# Patient Record
Sex: Female | Born: 1960 | Hispanic: No | Smoking: Former smoker
Health system: Southern US, Community
[De-identification: ages and names within clinical notes are randomized; demographics above are authoritative.]

## PROBLEM LIST (undated history)

## (undated) DIAGNOSIS — G479 Sleep disorder, unspecified: Secondary | ICD-10-CM

## (undated) DIAGNOSIS — I499 Cardiac arrhythmia, unspecified: Secondary | ICD-10-CM

## (undated) DIAGNOSIS — K219 Gastro-esophageal reflux disease without esophagitis: Secondary | ICD-10-CM

## (undated) DIAGNOSIS — F419 Anxiety disorder, unspecified: Secondary | ICD-10-CM

## (undated) DIAGNOSIS — D649 Anemia, unspecified: Secondary | ICD-10-CM

## (undated) DIAGNOSIS — D259 Leiomyoma of uterus, unspecified: Secondary | ICD-10-CM

## (undated) DIAGNOSIS — I10 Essential (primary) hypertension: Secondary | ICD-10-CM

## (undated) DIAGNOSIS — G43109 Migraine with aura, not intractable, without status migrainosus: Secondary | ICD-10-CM

## (undated) HISTORY — DX: Essential (primary) hypertension: I10

## (undated) HISTORY — DX: Sleep disorder, unspecified: G47.9

## (undated) HISTORY — PX: OTHER SURGICAL HISTORY: SHX169

## (undated) HISTORY — DX: Gastro-esophageal reflux disease without esophagitis: K21.9

## (undated) HISTORY — DX: Cardiac arrhythmia, unspecified: I49.9

## (undated) HISTORY — DX: Anemia, unspecified: D64.9

## (undated) HISTORY — DX: Anxiety disorder, unspecified: F41.9

## (undated) HISTORY — DX: Migraine with aura, not intractable, without status migrainosus: G43.109

## (undated) HISTORY — DX: Leiomyoma of uterus, unspecified: D25.9

---

## 1986-05-24 HISTORY — PX: OVARIAN CYST REMOVAL: SHX89

## 2008-12-22 HISTORY — PX: CARDIOVASCULAR STRESS TEST: SHX262

## 2008-12-22 HISTORY — PX: DOPPLER ECHOCARDIOGRAPHY: SHX263

## 2011-02-22 LAB — HM MAMMOGRAPHY: HM Mammogram: NORMAL

## 2011-09-03 ENCOUNTER — Ambulatory Visit: Payer: Commercial Managed Care - PPO | Admitting: Internal Medicine

## 2011-10-28 ENCOUNTER — Other Ambulatory Visit: Payer: Self-pay | Admitting: Specialist

## 2011-10-28 DIAGNOSIS — M5410 Radiculopathy, site unspecified: Secondary | ICD-10-CM

## 2011-10-28 DIAGNOSIS — M542 Cervicalgia: Secondary | ICD-10-CM

## 2011-10-28 DIAGNOSIS — M545 Low back pain: Secondary | ICD-10-CM

## 2011-11-10 ENCOUNTER — Encounter: Payer: Self-pay | Admitting: Internal Medicine

## 2011-11-10 ENCOUNTER — Ambulatory Visit (INDEPENDENT_AMBULATORY_CARE_PROVIDER_SITE_OTHER): Payer: Commercial Managed Care - PPO | Admitting: Internal Medicine

## 2011-11-10 VITALS — BP 138/88 | HR 96 | Temp 98.9°F | Ht 63.0 in | Wt 130.0 lb

## 2011-11-10 DIAGNOSIS — G479 Sleep disorder, unspecified: Secondary | ICD-10-CM | POA: Insufficient documentation

## 2011-11-10 DIAGNOSIS — K219 Gastro-esophageal reflux disease without esophagitis: Secondary | ICD-10-CM | POA: Insufficient documentation

## 2011-11-10 DIAGNOSIS — G43109 Migraine with aura, not intractable, without status migrainosus: Secondary | ICD-10-CM

## 2011-11-10 DIAGNOSIS — I10 Essential (primary) hypertension: Secondary | ICD-10-CM

## 2011-11-10 DIAGNOSIS — I499 Cardiac arrhythmia, unspecified: Secondary | ICD-10-CM | POA: Insufficient documentation

## 2011-11-10 DIAGNOSIS — D259 Leiomyoma of uterus, unspecified: Secondary | ICD-10-CM

## 2011-11-10 DIAGNOSIS — D649 Anemia, unspecified: Secondary | ICD-10-CM | POA: Insufficient documentation

## 2011-11-10 LAB — CBC WITH DIFFERENTIAL/PLATELET
Basophils Absolute: 0 10*3/uL (ref 0.0–0.1)
Eosinophils Absolute: 0.2 10*3/uL (ref 0.0–0.7)
Lymphocytes Relative: 16.8 % (ref 12.0–46.0)
MCHC: 32.1 g/dL (ref 30.0–36.0)
MCV: 78.6 fl (ref 78.0–100.0)
Monocytes Absolute: 0.4 10*3/uL (ref 0.1–1.0)
Neutro Abs: 5.9 10*3/uL (ref 1.4–7.7)
Neutrophils Relative %: 75.4 % (ref 43.0–77.0)
RDW: 18.1 % — ABNORMAL HIGH (ref 11.5–14.6)

## 2011-11-10 LAB — HEPATIC FUNCTION PANEL
Bilirubin, Direct: 0 mg/dL (ref 0.0–0.3)
Total Bilirubin: 0.3 mg/dL (ref 0.3–1.2)
Total Protein: 7.1 g/dL (ref 6.0–8.3)

## 2011-11-10 LAB — BASIC METABOLIC PANEL
BUN: 11 mg/dL (ref 6–23)
CO2: 25 mEq/L (ref 19–32)
Calcium: 8.8 mg/dL (ref 8.4–10.5)
Creatinine, Ser: 0.9 mg/dL (ref 0.4–1.2)
Glucose, Bld: 101 mg/dL — ABNORMAL HIGH (ref 70–99)

## 2011-11-10 MED ORDER — OMEPRAZOLE 20 MG PO CPDR: 20.0000 mg | DELAYED_RELEASE_CAPSULE | Freq: Every day | ORAL | Status: DC

## 2011-11-10 NOTE — Assessment & Plan Note (Signed)
Uses fioricet mostly in perimenstrual time

## 2011-11-10 NOTE — Assessment & Plan Note (Signed)
Persistent bleeding Just tried progesterone challenge again May need gyn if not stopping

## 2011-11-10 NOTE — Patient Instructions (Signed)
Please request the last 3 years of records from your [facility] doctor Please stop the zolpidem Remus Loffler). If you are not sleeping well after a couple of weeks, call for a different prescription
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
## 2011-11-10 NOTE — Assessment & Plan Note (Signed)
BP Readings from Last 3 Encounters:  11/10/11 138/88   Control is fine ?arrhythmia in past --- nothing on exam now Will check labs

## 2011-11-10 NOTE — Assessment & Plan Note (Signed)
Prevacid OTC helped at first but not now Will try prilosec

## 2011-11-11 ENCOUNTER — Encounter: Payer: Self-pay | Admitting: *Deleted

## 2011-11-19 ENCOUNTER — Ambulatory Visit
Admission: RE | Admit: 2011-11-19 | Discharge: 2011-11-19 | Disposition: A | Discharge status: Home / Self Care | Payer: Commercial Managed Care - PPO | Source: Ambulatory Visit | Attending: Specialist | Admitting: Specialist

## 2011-11-19 VITALS — BP 97/59 | HR 63

## 2011-11-19 DIAGNOSIS — G43109 Migraine with aura, not intractable, without status migrainosus: Secondary | ICD-10-CM

## 2011-11-19 DIAGNOSIS — M5410 Radiculopathy, site unspecified: Secondary | ICD-10-CM

## 2011-11-19 DIAGNOSIS — M545 Low back pain, unspecified: Secondary | ICD-10-CM

## 2011-11-19 DIAGNOSIS — M542 Cervicalgia: Secondary | ICD-10-CM

## 2011-11-19 MED ORDER — MEPERIDINE HCL 100 MG/ML IJ SOLN
75.0000 mg | Freq: Once | INTRAMUSCULAR | Status: AC
  Administered 2011-11-19: 75 mg via INTRAMUSCULAR

## 2011-11-19 MED ORDER — DIAZEPAM 5 MG PO TABS
10.0000 mg | ORAL_TABLET | Freq: Once | ORAL | Status: AC
  Administered 2011-11-19: 10 mg via ORAL

## 2011-11-19 MED ORDER — ONDANSETRON HCL 4 MG/2ML IJ SOLN
4.0000 mg | Freq: Once | INTRAMUSCULAR | Status: AC
  Administered 2011-11-19: 4 mg via INTRAMUSCULAR

## 2011-11-19 MED ORDER — IOHEXOL 300 MG/ML  SOLN
10.0000 mL | Freq: Once | INTRAMUSCULAR | Status: AC | PRN
  Administered 2011-11-19: 10 mL via INTRATHECAL

## 2011-11-19 MED ORDER — HYDROCODONE-ACETAMINOPHEN 5-325 MG PO TABS
2.0000 | ORAL_TABLET | Freq: Once | ORAL | Status: AC
  Administered 2011-11-19: 2 via ORAL

## 2011-11-19 NOTE — Progress Notes (Signed)
States she has been off her Despipramine for at least the past two days. Larina Earthly, RN

## 2011-11-19 NOTE — Discharge Instructions (Signed)
Myelogram Discharge Instructions  1. Go home and rest quietly for the next 24 hours.  It is important to lie flat for the next 24 hours.  Get up only to go to the restroom.  You may lie in the bed or on a couch on your back, your stomach, your left side or your right side.  You may have one pillow under your head.  You may have pillows between your knees while you are on your side or under your knees while you are on your back.  2. DO NOT drive today.  Recline the seat as far back as it will go, while still wearing your seat belt, on the way home.  3. You may get up to go to the bathroom as needed.  You may sit up for 10 minutes to eat.  You may resume your normal diet and medications unless otherwise indicated.  Drink lots of extra fluids today and tomorrow.  4. The incidence of headache, nausea, or vomiting is about 5% (one in 20 patients).  If you develop a headache, lie flat and drink plenty of fluids until the headache goes away.  Caffeinated beverages may be helpful.  If you develop severe nausea and vomiting or a headache that does not go away with flat bed rest, call [phone].  5. You may resume normal activities after your 24 hours of bed rest is over; however, do not exert yourself strongly or do any heavy lifting tomorrow. If when you get up you have a headache when standing, go back to bed and force fluids for another 24 hours.  6. Call your physician for a follow-up appointment.  The results of your myelogram will be sent directly to your physician by the following day.  7. If you have any questions or if complications develop after you arrive home, please call [phone].  Discharge instructions have been explained to the patient.  The patient, or the person responsible for the patient, fully understands these instructions.       May resume desipramine on November 20, 2011, after 9:30 am.

## 2011-11-22 ENCOUNTER — Telehealth: Payer: Self-pay | Admitting: Radiology

## 2011-11-22 NOTE — Telephone Encounter (Signed)
Pt states if she gets up her head hurts and if she is lying down it is better. Will call Dr. Barbaraann Faster office to see if we can obtain an order for a blood patch. Office called at 8:50 am and will call us back.

## 2011-11-22 NOTE — Telephone Encounter (Signed)
Pt will re-evaluate the way she feels later today and decide if she wishes to have the blood patch.

## 2011-11-23 ENCOUNTER — Other Ambulatory Visit: Payer: Self-pay | Admitting: Specialist

## 2011-11-23 DIAGNOSIS — G971 Other reaction to spinal and lumbar puncture: Secondary | ICD-10-CM

## 2011-11-26 ENCOUNTER — Ambulatory Visit
Admission: RE | Admit: 2011-11-26 | Discharge: 2011-11-26 | Disposition: A | Discharge status: Home / Self Care | Payer: Commercial Managed Care - PPO | Source: Ambulatory Visit | Attending: Specialist | Admitting: Specialist

## 2011-11-26 DIAGNOSIS — G971 Other reaction to spinal and lumbar puncture: Secondary | ICD-10-CM

## 2011-11-26 MED ORDER — IOHEXOL 180 MG/ML  SOLN
1.0000 mL | Freq: Once | INTRAMUSCULAR | Status: AC | PRN
  Administered 2011-11-26: 1 mL via EPIDURAL

## 2011-11-26 MED ORDER — HYDROXYZINE HCL 50 MG/ML IM SOLN
50.0000 mg | Freq: Once | INTRAMUSCULAR | Status: AC
  Administered 2011-11-26: 50 mg via INTRAMUSCULAR

## 2011-11-26 MED ORDER — OXYCODONE-ACETAMINOPHEN 5-325 MG PO TABS
2.0000 | ORAL_TABLET | Freq: Once | ORAL | Status: AC
  Administered 2011-11-26: 2 via ORAL

## 2011-11-26 MED ORDER — HYDROMORPHONE HCL PF 2 MG/ML IJ SOLN
2.0000 mg | Freq: Once | INTRAMUSCULAR | Status: AC
  Administered 2011-11-26: 2 mg via INTRAMUSCULAR

## 2011-11-26 NOTE — Progress Notes (Signed)
20cc blood drawn from right AC space without difficulty for procedure; site unremarkable.  jkl 

## 2011-11-26 NOTE — Progress Notes (Signed)
Rollene Rotunda (friend) at bedside.  Dr. Alfredo Batty in to speak with patient and her friend.  jkl

## 2011-12-02 ENCOUNTER — Other Ambulatory Visit (HOSPITAL_COMMUNITY): Payer: Self-pay | Admitting: Specialist

## 2011-12-02 DIAGNOSIS — M545 Low back pain: Secondary | ICD-10-CM

## 2011-12-02 DIAGNOSIS — M542 Cervicalgia: Secondary | ICD-10-CM

## 2011-12-07 ENCOUNTER — Encounter (HOSPITAL_COMMUNITY)
Admission: RE | Admit: 2011-12-07 | Discharge: 2011-12-07 | Disposition: A | Discharge status: Home / Self Care | Payer: Commercial Managed Care - PPO | Source: Ambulatory Visit | Attending: Specialist | Admitting: Specialist

## 2011-12-07 DIAGNOSIS — M545 Low back pain, unspecified: Secondary | ICD-10-CM | POA: Insufficient documentation

## 2011-12-07 DIAGNOSIS — M542 Cervicalgia: Secondary | ICD-10-CM | POA: Insufficient documentation

## 2011-12-07 MED ORDER — TECHNETIUM TC 99M MEDRONATE IV KIT: 25.0000 | PACK | Freq: Once | INTRAVENOUS | Status: DC | PRN

## 2011-12-10 ENCOUNTER — Encounter: Payer: Self-pay | Admitting: Internal Medicine

## 2011-12-10 ENCOUNTER — Ambulatory Visit (INDEPENDENT_AMBULATORY_CARE_PROVIDER_SITE_OTHER): Payer: Commercial Managed Care - PPO | Admitting: Internal Medicine

## 2011-12-10 VITALS — BP 140/90 | HR 94 | Temp 98.5°F | Ht 63.0 in | Wt 131.0 lb

## 2011-12-10 DIAGNOSIS — H9190 Unspecified hearing loss, unspecified ear: Secondary | ICD-10-CM

## 2011-12-10 MED ORDER — PREDNISONE 20 MG PO TABS: 40.0000 mg | ORAL_TABLET | Freq: Every day | ORAL | Status: AC

## 2011-12-10 NOTE — Assessment & Plan Note (Signed)
Likely related to myelogram but not sure how Since she has mild hearing loss across the frequencies, will try steroid burst and set up with ENT

## 2011-12-10 NOTE — Progress Notes (Signed)
  Subjective:    Patient ID: Brittany Hess, female    DOB: [birth year], 51 y.o.   MRN: [id number]  HPI Had myelogram ~3 weeks ago Did find bony growth in cervical spine so considering surgery  Has had trouble with her hearing since the myelogram Had blood patch due to headaches after procedure Then had loud noise (like riding down road with windows open) dissapated some but now has pressure feeling Now with gurgling or some sensation of bubbling in ears  Sensitive to loud sounds---like closing door, or crumbling paper  "I just don't feel right"  Current Outpatient Prescriptions on File Prior to Visit  Medication Sig Dispense Refill  . amLODipine (NORVASC) 5 MG tablet Take 5 mg by mouth daily.       . benazepril (LOTENSIN) 40 MG tablet Take 40 mg by mouth daily.       . butalbital-acetaminophen-caffeine (FIORICET, ESGIC) 50-325-40 MG per tablet Take 1-2 tablets by mouth 2 (two) times daily as needed.       . norethindrone (AYGESTIN) 5 MG tablet Take 5 mg by mouth daily.      Marland Kitchen omeprazole (PRILOSEC) 20 MG capsule Take 1 capsule (20 mg total) by mouth daily.  30 capsule  11    No Known Allergies  Past Medical History  Diagnosis Date  . Hypertension   . GERD (gastroesophageal reflux disease)   . Migraine with aura   . Anemia   . Arrhythmia   . Fibroid uterus   . Sleep disturbance, unspecified     Past Surgical History  Procedure Date  . Ovarian cyst removal 1988    right  . Labia cyst     1980    Family History  Problem Relation Age of Onset  . Hypertension Mother   . Parkinsonism Mother   . Dementia Mother   . Hypertension Father   . Diabetes Neg Hx   . Cancer Neg Hx     History   Social History  . Marital Status: Divorced    Spouse Name: N/A    Number of Children: 1  . Years of Education: N/A   Occupational History  . Account management/sales     [employer]   Social History Main Topics  . Smoking status: Never Smoker   . Smokeless  tobacco: Never Used  . Alcohol Use: Yes     Occasional  . Drug Use: No  . Sexually Active: Not on file   Other Topics Concern  . Not on file   Social History Narrative   63 year old daughter lives with father now in [place].Will move here to attend GTCCSon died of SIDS    Review of Systems No cold symptoms like cough, rhinorrhea No sig GI symptoms    Objective:   Physical Exam  Constitutional: She appears well-developed and well-nourished. No distress.  HENT:  Nose: Nose normal.  Mouth/Throat: Oropharynx is clear and moist. No oropharyngeal exudate.       TMS and canals appear normal  Psychiatric: She has a normal mood and affect. Her behavior is normal. Thought content normal.          Assessment & Plan:

## 2012-01-07 ENCOUNTER — Ambulatory Visit (INDEPENDENT_AMBULATORY_CARE_PROVIDER_SITE_OTHER): Payer: Commercial Managed Care - PPO | Admitting: Internal Medicine

## 2012-01-07 ENCOUNTER — Encounter: Payer: Self-pay | Admitting: Internal Medicine

## 2012-01-07 VITALS — BP 120/80 | HR 81 | Temp 98.6°F | Wt 131.0 lb

## 2012-01-07 DIAGNOSIS — G479 Sleep disorder, unspecified: Secondary | ICD-10-CM

## 2012-01-07 DIAGNOSIS — I499 Cardiac arrhythmia, unspecified: Secondary | ICD-10-CM

## 2012-01-07 DIAGNOSIS — M5000 Cervical disc disorder with myelopathy, unspecified cervical region: Secondary | ICD-10-CM

## 2012-01-07 MED ORDER — TRAZODONE HCL 50 MG PO TABS: 50.0000 mg | ORAL_TABLET | Freq: Every day | ORAL | Status: DC

## 2012-01-07 NOTE — Assessment & Plan Note (Addendum)
Vague history of bradycardia Thinks heart was structurally normal though Symptoms improved off the diuretic Has soft aortic murmur Still has occ mild palpitations EKG was normal  I would like to defer surgery till I can get a copy of her echo from [place] If normal, would be okay to proceed She wants to get evaluation by neurologist first anyway ---since uncertainty about her lesions and the symptoms correlating properly

## 2012-01-07 NOTE — Patient Instructions (Signed)
Please get a copy of your echocardiogram from [facility]

## 2012-01-07 NOTE — Assessment & Plan Note (Signed)
Is just about off the ambien---takes 1/2 tab Melatonin didn't help Will try trazodone

## 2012-01-07 NOTE — Progress Notes (Signed)
Subjective:    Patient ID: Brittany Hess, female    DOB: 07-20-1960, 51 y.o.   MRN: [id number]  HPI Due for cervical spine surgery No specific date yet Here for clearance for Dr Otelia Sergeant  She is concerned because the findings on myelogram are on the right However she has left arm pain Also has left leg pain but lumbar findings are on right  Feels she needs "something" But is concerned about the uncertainty Wonders whether a neurology evaluation pre op would sense  No chest pain No SOB Occ gets palpitations---nothing new for her. Generally happens when at rest, but occ during a walk Sensation of going too fast then--will last a few seconds Does get some SOB if she gets symptoms during exercise (like walking) In past, found to be bradycardic due to BP med (thinks it was HCTZ)  Thinks she got echo in [place] (?2010)  Current Outpatient Prescriptions on File Prior to Visit  Medication Sig Dispense Refill  . amLODipine (NORVASC) 5 MG tablet Take 5 mg by mouth daily.       . benazepril (LOTENSIN) 40 MG tablet Take 40 mg by mouth daily.       Marland Kitchen omeprazole (PRILOSEC) 20 MG capsule Take 1 capsule (20 mg total) by mouth daily.  30 capsule  11    No Known Allergies  Past Medical History  Diagnosis Date  . Hypertension   . GERD (gastroesophageal reflux disease)   . Migraine with aura   . Anemia   . Arrhythmia   . Fibroid uterus   . Sleep disturbance, unspecified     Past Surgical History  Procedure Date  . Ovarian cyst removal 1988    right  . Labia cyst     1980    Family History  Problem Relation Age of Onset  . Hypertension Mother   . Parkinsonism Mother   . Dementia Mother   . Hypertension Father   . Diabetes Neg Hx   . Cancer Neg Hx     History   Social History  . Marital Status: Divorced    Spouse Name: N/A    Number of Children: 1  . Years of Education: N/A   Occupational History  . Account management/sales     [employer]   Social  History Main Topics  . Smoking status: Never Smoker   . Smokeless tobacco: Never Used  . Alcohol Use: Yes     Occasional  . Drug Use: No  . Sexually Active: Not on file   Other Topics Concern  . Not on file   Social History Narrative   41 year old daughter lives with father now in [place].Will move here to attend GTCCSon died of SIDS    Review of Systems Hearing has improved some since the myelogram Feels it got better after she flew in plane---still not at baseline    Objective:   Physical Exam  Constitutional: She appears well-developed and well-nourished. No distress.  Neck: Normal range of motion. Neck supple. No thyromegaly present.  Cardiovascular: Normal rate and regular rhythm.  Exam reveals no gallop.        Soft aortic systolic murmur No click or opening snap  Pulmonary/Chest: Effort normal and breath sounds normal. No respiratory distress. She has no wheezes. She has no rales.  Musculoskeletal: She exhibits no edema and no tenderness.  Lymphadenopathy:    She has no cervical adenopathy.          Assessment & Plan:

## 2012-01-07 NOTE — Assessment & Plan Note (Signed)
Wants reevaluation due to concerns about what exactly should be done

## 2012-01-21 ENCOUNTER — Encounter: Payer: Self-pay | Admitting: Internal Medicine

## 2012-02-22 ENCOUNTER — Telehealth: Payer: Self-pay | Admitting: Internal Medicine

## 2012-02-22 NOTE — Telephone Encounter (Signed)
Patient is having back surgery by Dr.Nitka the middle of December.  Patient would like to see you for a cpx before the surgery.  Your next available for a cpx is in April.  Can patient be scheduled sooner for a cpx?

## 2012-02-22 NOTE — Telephone Encounter (Signed)
Brittany Hess schedule patient for 9:30am on 04/28/2012

## 2012-03-20 ENCOUNTER — Ambulatory Visit (INDEPENDENT_AMBULATORY_CARE_PROVIDER_SITE_OTHER): Payer: Commercial Managed Care - PPO | Admitting: Family Medicine

## 2012-03-20 ENCOUNTER — Encounter: Payer: Self-pay | Admitting: Family Medicine

## 2012-03-20 ENCOUNTER — Other Ambulatory Visit: Payer: Self-pay | Admitting: *Deleted

## 2012-03-20 VITALS — BP 152/84 | HR 77 | Temp 98.5°F | Wt 131.0 lb

## 2012-03-20 DIAGNOSIS — L989 Disorder of the skin and subcutaneous tissue, unspecified: Secondary | ICD-10-CM

## 2012-03-20 MED ORDER — AMLODIPINE BESYLATE 5 MG PO TABS: 5.0000 mg | ORAL_TABLET | Freq: Every day | ORAL | Status: DC

## 2012-03-20 MED ORDER — LIDOCAINE HCL 3 % EX CREA: 1.0000 "application " | TOPICAL_CREAM | Freq: Three times a day (TID) | CUTANEOUS | Status: DC | PRN

## 2012-03-20 NOTE — Progress Notes (Signed)
She thought she had a UTI but this is resolved-  no dysuria now.  Prev abnormal urinary odor is resolved.   She has an itchy spot on her face.  It wasn't resolved with topical treatment per derm.  It was proposed for biopsy but patient wanted to know what else could be done.  Was prev on ketoconazole, sulfacetamide, and atropro 2%, doxycycline.  Had seen Dr. Danella Deis.  Spot has been present for March 2012.   She has irritation, itching and some discomfort in the area.  That leads to more itching and the cycle repeats.  On L upper lip.    Meds, vitals, and allergies reviewed.   ROS: See HPI.  Otherwise, noncontributory.  nad ncat Tm wnl Nasal and OP exam wnl L upper lip 1.5 x 1.5 faintly hyperpigmented and slightly excoriated area w/o ulceration or mass Neck supple, no LA

## 2012-03-20 NOTE — Patient Instructions (Addendum)
Use the lidocaine cream.  If this doesn't resolve in the next two weeks, then follow up with dermatology.

## 2012-03-20 NOTE — Assessment & Plan Note (Signed)
Unclear source but this doesn't appear cancerous.  She wanted to avoid biopsy.  She asked about topical lidocaine treatment to try to disrupt the pain/itch/irritation cycle.  This is reasonable for a short course.  If not improved in about 2 weeks, then I want her to fu with derm.  She understood.

## 2012-04-11 ENCOUNTER — Other Ambulatory Visit: Payer: Self-pay

## 2012-04-11 MED ORDER — BENAZEPRIL HCL 40 MG PO TABS: 40.0000 mg | ORAL_TABLET | Freq: Every day | ORAL | Status: DC

## 2012-04-11 NOTE — Telephone Encounter (Signed)
Pt said when seen by Dr Para March amlodipine and benazepril were to be called to [facility]. Amlodipine was sent but not benazepril. Benazepril called to [facility] spoke with Marchelle Folks. Pt aware med called in.

## 2012-04-28 ENCOUNTER — Other Ambulatory Visit (HOSPITAL_COMMUNITY)
Admission: RE | Admit: 2012-04-28 | Discharge: 2012-04-28 | Disposition: A | Discharge status: Home / Self Care | Payer: Commercial Managed Care - PPO | Source: Ambulatory Visit | Attending: Internal Medicine | Admitting: Internal Medicine

## 2012-04-28 ENCOUNTER — Encounter: Payer: Self-pay | Admitting: Internal Medicine

## 2012-04-28 ENCOUNTER — Ambulatory Visit (INDEPENDENT_AMBULATORY_CARE_PROVIDER_SITE_OTHER): Payer: Commercial Managed Care - PPO | Admitting: Internal Medicine

## 2012-04-28 VITALS — BP 130/84 | HR 72 | Temp 98.7°F | Ht 62.5 in | Wt 132.0 lb

## 2012-04-28 DIAGNOSIS — F411 Generalized anxiety disorder: Secondary | ICD-10-CM

## 2012-04-28 DIAGNOSIS — Z113 Encounter for screening for infections with a predominantly sexual mode of transmission: Secondary | ICD-10-CM

## 2012-04-28 DIAGNOSIS — F419 Anxiety disorder, unspecified: Secondary | ICD-10-CM

## 2012-04-28 DIAGNOSIS — I10 Essential (primary) hypertension: Secondary | ICD-10-CM

## 2012-04-28 DIAGNOSIS — Z0000 Encounter for general adult medical examination without abnormal findings: Secondary | ICD-10-CM

## 2012-04-28 DIAGNOSIS — Z01419 Encounter for gynecological examination (general) (routine) without abnormal findings: Secondary | ICD-10-CM | POA: Insufficient documentation

## 2012-04-28 DIAGNOSIS — G479 Sleep disorder, unspecified: Secondary | ICD-10-CM

## 2012-04-28 DIAGNOSIS — Z23 Encounter for immunization: Secondary | ICD-10-CM

## 2012-04-28 MED ORDER — TEMAZEPAM 15 MG PO CAPS: 15.0000 mg | ORAL_CAPSULE | Freq: Every evening | ORAL | Status: DC | PRN
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
## 2012-04-28 NOTE — Addendum Note (Signed)
Addended by: Eliezer Bottom on: 04/28/2012 11:15 AM   Modules accepted: Orders

## 2012-04-28 NOTE — Assessment & Plan Note (Signed)
Has PTSD like intrusive thoughts and trouble travelling in car Will set up with psychologist

## 2012-04-28 NOTE — Assessment & Plan Note (Signed)
Overall healthy Will defer mammo till next year Pap done Flu and Tdap

## 2012-04-28 NOTE — Assessment & Plan Note (Signed)
BP Readings from Last 3 Encounters:  04/28/12 130/84  03/20/12 152/84  01/07/12 120/80   Good control Labs okay earlier this year

## 2012-04-28 NOTE — Assessment & Plan Note (Signed)
Will try temazepam

## 2012-04-29 LAB — HIV ANTIBODY (ROUTINE TESTING W REFLEX): HIV: NONREACTIVE

## 2012-05-03 ENCOUNTER — Encounter: Payer: Self-pay | Admitting: *Deleted

## 2012-05-22 ENCOUNTER — Telehealth: Payer: Self-pay | Admitting: Internal Medicine

## 2012-05-22 NOTE — Telephone Encounter (Signed)
[facility] Triage Call Report Triage Record Num: [id number] Operator: Albertine Grates Patient Name: Brittany Hess Call Date & Time: 05/19/2012 6:30:09PM Patient Phone: [phone] PCP: Tillman Abide Patient Gender: Female PCP Fax : [phone] Patient DOB: 1960-09-19 Practice Name: [facility] Reason for Call: Caller: Preet/Patient; PCP: Tillman Abide (Family Practice); CB#: [phone]; Has "sinus" symptoms since 12-23. Nose burns. Has headache. Afebrile. Has taken Motrin 12-26 and Mucinex 12-27. Mucinex has helped with congestion. Declines appointment 12-28 and will continue to manage at home. Protocol(s) Used: Upper Respiratory Infection (URI) Recommended Outcome per Protocol: See Provider within 24 hours Reason for Outcome: Mild to moderate headache for more than 24 hours unrelieved with nonprescription medications Care Advice: A warm, moist compress placed on face, over eyes for 15 to 20 minutes, 5 to 6 times a day, may help relieve the congestion. ~ Most adults need to drink 6-10 eight-ounce glasses (1.2-2.0 liters) of fluids per day unless previously told to limit fluid intake for other medical reasons. Limit fluids that contain caffeine, sugar or alcohol. Urine will be a very light yellow color when you drink enough fluids. ~ 05/19/2012 6:36:12PM Page 1 of 1 [email or web address]

## 2012-05-22 NOTE — Telephone Encounter (Signed)
Please check on her today

## 2012-05-23 NOTE — Telephone Encounter (Signed)
Spoke with patient and she states she's better, everything is clearing up

## 2012-06-27 ENCOUNTER — Other Ambulatory Visit: Payer: Self-pay | Admitting: Internal Medicine

## 2012-06-27 NOTE — Telephone Encounter (Signed)
rx called into pharmacy

## 2012-06-27 NOTE — Telephone Encounter (Signed)
Okay #[id number] x 0 

## 2012-07-20 ENCOUNTER — Telehealth: Payer: Self-pay

## 2012-07-20 NOTE — Telephone Encounter (Signed)
Aileen with [employer] and [employer] request status of  Certified medical records and itemized statement for DOS 10/08/10 thru present requested 06/20/12. Cannot find listed in [employer] book. Karie Kirks will refax.

## 2012-07-26 ENCOUNTER — Other Ambulatory Visit: Payer: Self-pay | Admitting: Internal Medicine

## 2012-07-26 NOTE — Telephone Encounter (Signed)
Okay #[id number] x 0 

## 2012-07-26 NOTE — Telephone Encounter (Signed)
rx called into pharmacy

## 2012-08-03 NOTE — Telephone Encounter (Signed)
Brittany Hess left v/m the law office received itemized statement but not certified medical records.Left message on v/m that need to pay and then the certified medical records will be released.

## 2012-08-24 ENCOUNTER — Other Ambulatory Visit: Payer: Self-pay | Admitting: Internal Medicine

## 2012-08-24 NOTE — Telephone Encounter (Signed)
Okay #[id number] x 0 

## 2012-08-28 ENCOUNTER — Other Ambulatory Visit: Payer: Self-pay | Admitting: Internal Medicine

## 2012-08-29 NOTE — Telephone Encounter (Signed)
I'm not sure, looks like it wasn't called in, will call in now rx called into pharmacy

## 2012-08-29 NOTE — Telephone Encounter (Signed)
This was just approved 4/3 What happened?

## 2012-10-22 ENCOUNTER — Other Ambulatory Visit: Payer: Self-pay | Admitting: Internal Medicine

## 2012-10-23 NOTE — Telephone Encounter (Signed)
Okay #[id number] x 0 

## 2012-10-23 NOTE — Telephone Encounter (Signed)
rx called into pharmacy

## 2012-10-27 ENCOUNTER — Ambulatory Visit: Payer: Commercial Managed Care - PPO | Admitting: Internal Medicine

## 2012-11-08 ENCOUNTER — Encounter: Payer: Self-pay | Admitting: Neurology

## 2012-11-28 ENCOUNTER — Other Ambulatory Visit: Payer: Self-pay | Admitting: Internal Medicine

## 2012-11-28 NOTE — Telephone Encounter (Signed)
okay

## 2012-11-28 NOTE — Telephone Encounter (Signed)
Okay #[id number] x 0 She needs to reschedule her follow up appt

## 2012-11-28 NOTE — Telephone Encounter (Signed)
rx called into pharmacy Spoke with patient and she states she lost her insurance and can't come for OV, I advised we can't keep refilling meds without OV, advised about [facility], per pt she will let us know.

## 2013-01-03 ENCOUNTER — Telehealth: Payer: Self-pay

## 2013-01-03 ENCOUNTER — Other Ambulatory Visit: Payer: Self-pay | Admitting: Internal Medicine

## 2013-01-03 NOTE — Telephone Encounter (Signed)
Pt said she cannot afford office visit after losing insurance 05/24/12. Pt request information on [facility] and [facility]. Information at front desk for pick up. Pt voiced understanding.

## 2013-04-01 ENCOUNTER — Other Ambulatory Visit: Payer: Self-pay | Admitting: Internal Medicine

## 2013-04-10 ENCOUNTER — Other Ambulatory Visit: Payer: Self-pay | Admitting: Internal Medicine

## 2013-06-28 ENCOUNTER — Encounter: Payer: Self-pay | Admitting: Internal Medicine

## 2013-06-28 ENCOUNTER — Ambulatory Visit (INDEPENDENT_AMBULATORY_CARE_PROVIDER_SITE_OTHER): Payer: 59 | Admitting: Internal Medicine

## 2013-06-28 VITALS — BP 142/96 | HR 77 | Temp 98.2°F | Wt 134.2 lb

## 2013-06-28 DIAGNOSIS — J309 Allergic rhinitis, unspecified: Secondary | ICD-10-CM

## 2013-06-28 MED ORDER — FLUTICASONE PROPIONATE 50 MCG/ACT NA SUSP: 2.0000 | Freq: Every day | NASAL | Status: DC

## 2013-06-28 NOTE — Patient Instructions (Signed)
Allergic Rhinitis Allergic rhinitis is when the mucous membranes in the nose respond to allergens. Allergens are particles in the air that cause your body to have an allergic reaction. This causes you to release allergic antibodies. Through a chain of events, these eventually cause you to release histamine into the blood stream. Although meant to protect the body, it is this release of histamine that causes your discomfort, such as frequent sneezing, congestion, and an itchy, runny nose.  CAUSES  Seasonal allergic rhinitis (hay fever) is caused by pollen allergens that may come from grasses, trees, and weeds. Year-round allergic rhinitis (perennial allergic rhinitis) is caused by allergens such as house dust mites, pet dander, and mold spores.  SYMPTOMS   Nasal stuffiness (congestion).  Itchy, runny nose with sneezing and tearing of the eyes. DIAGNOSIS  Your health care provider can help you determine the allergen or allergens that trigger your symptoms. If you and your health care provider are unable to determine the allergen, skin or blood testing may be used. TREATMENT  Allergic Rhinitis does not have a cure, but it can be controlled by:  Medicines and allergy shots (immunotherapy).  Avoiding the allergen. Hay fever may often be treated with antihistamines in pill or nasal spray forms. Antihistamines block the effects of histamine. There are over-the-counter medicines that may help with nasal congestion and swelling around the eyes. Check with your health care provider before taking or giving this medicine.  If avoiding the allergen or the medicine prescribed do not work, there are many new medicines your health care provider can prescribe. Stronger medicine may be used if initial measures are ineffective. Desensitizing injections can be used if medicine and avoidance does not work. Desensitization is when a patient is given ongoing shots until the body becomes less sensitive to the allergen.  Make sure you follow up with your health care provider if problems continue. HOME CARE INSTRUCTIONS It is not possible to completely avoid allergens, but you can reduce your symptoms by taking steps to limit your exposure to them. It helps to know exactly what you are allergic to so that you can avoid your specific triggers. SEEK MEDICAL CARE IF:   You have a fever.  You develop a cough that does not stop easily (persistent).  You have shortness of breath.  You start wheezing.  Symptoms interfere with normal daily activities. Document Released: 02/02/2001 Document Revised: 02/28/2013 Document Reviewed: 01/15/2013 Exit[employer] Patient Information 2014 [employer].  

## 2013-06-28 NOTE — Progress Notes (Addendum)
Pre-visit discussion using our clinic review tool. No additional management support is needed unless otherwise documented below in the visit note.  
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
## 2013-08-10 ENCOUNTER — Other Ambulatory Visit: Payer: Self-pay | Admitting: Internal Medicine

## 2013-08-13 ENCOUNTER — Telehealth: Payer: Self-pay | Admitting: Internal Medicine

## 2013-08-13 NOTE — Telephone Encounter (Signed)
Patient is seen by Dr.Letvak.  Patient would rather see a female doctor.  Patient wants to know if she can switch to you.  Please advise.

## 2013-08-22 ENCOUNTER — Other Ambulatory Visit: Payer: Self-pay | Admitting: Family Medicine

## 2013-08-22 MED ORDER — AMLODIPINE BESYLATE 5 MG PO TABS: ORAL_TABLET | ORAL | Status: DC

## 2013-08-22 NOTE — Telephone Encounter (Signed)
Filled for 1 month.  Pt has appt next week to see Dr. Deborra Medina.

## 2013-08-30 ENCOUNTER — Other Ambulatory Visit (HOSPITAL_COMMUNITY)
Admission: RE | Admit: 2013-08-30 | Discharge: 2013-08-30 | Disposition: A | Discharge status: Home / Self Care | Payer: 59 | Source: Ambulatory Visit | Attending: Family Medicine | Admitting: Family Medicine

## 2013-08-30 ENCOUNTER — Telehealth: Payer: Self-pay | Admitting: Family Medicine

## 2013-08-30 ENCOUNTER — Encounter: Payer: Self-pay | Admitting: *Deleted

## 2013-08-30 ENCOUNTER — Ambulatory Visit (INDEPENDENT_AMBULATORY_CARE_PROVIDER_SITE_OTHER): Payer: 59 | Admitting: Family Medicine

## 2013-08-30 ENCOUNTER — Encounter: Payer: Self-pay | Admitting: Family Medicine

## 2013-08-30 VITALS — BP 140/84 | HR 80 | Temp 97.9°F | Ht 62.0 in | Wt 131.5 lb

## 2013-08-30 DIAGNOSIS — I10 Essential (primary) hypertension: Secondary | ICD-10-CM

## 2013-08-30 DIAGNOSIS — N760 Acute vaginitis: Secondary | ICD-10-CM | POA: Insufficient documentation

## 2013-08-30 DIAGNOSIS — Z136 Encounter for screening for cardiovascular disorders: Secondary | ICD-10-CM

## 2013-08-30 DIAGNOSIS — Z1231 Encounter for screening mammogram for malignant neoplasm of breast: Secondary | ICD-10-CM

## 2013-08-30 DIAGNOSIS — L989 Disorder of the skin and subcutaneous tissue, unspecified: Secondary | ICD-10-CM

## 2013-08-30 DIAGNOSIS — Z01419 Encounter for gynecological examination (general) (routine) without abnormal findings: Secondary | ICD-10-CM | POA: Insufficient documentation

## 2013-08-30 DIAGNOSIS — Z0000 Encounter for general adult medical examination without abnormal findings: Secondary | ICD-10-CM

## 2013-08-30 DIAGNOSIS — R87810 Cervical high risk human papillomavirus (HPV) DNA test positive: Secondary | ICD-10-CM | POA: Insufficient documentation

## 2013-08-30 DIAGNOSIS — Z113 Encounter for screening for infections with a predominantly sexual mode of transmission: Secondary | ICD-10-CM | POA: Insufficient documentation

## 2013-08-30 DIAGNOSIS — G479 Sleep disorder, unspecified: Secondary | ICD-10-CM

## 2013-08-30 DIAGNOSIS — Z1151 Encounter for screening for human papillomavirus (HPV): Secondary | ICD-10-CM | POA: Insufficient documentation

## 2013-08-30 DIAGNOSIS — F988 Other specified behavioral and emotional disorders with onset usually occurring in childhood and adolescence: Secondary | ICD-10-CM | POA: Insufficient documentation

## 2013-08-30 LAB — COMPREHENSIVE METABOLIC PANEL
ALBUMIN: 3.9 g/dL (ref 3.5–5.2)
ALT: 15 U/L (ref 0–35)
AST: 17 U/L (ref 0–37)
Alkaline Phosphatase: 72 U/L (ref 39–117)
BUN: 11 mg/dL (ref 6–23)
CHLORIDE: 104 meq/L (ref 96–112)
CO2: 29 mEq/L (ref 19–32)
Calcium: 9.4 mg/dL (ref 8.4–10.5)
Creatinine, Ser: 0.9 mg/dL (ref 0.4–1.2)
GFR: 71.51 mL/min (ref >60.00)
GLUCOSE: 76 mg/dL (ref 70–99)
POTASSIUM: 4 meq/L (ref 3.5–5.1)
Sodium: 140 mEq/L (ref 135–145)
TOTAL PROTEIN: 7.1 g/dL (ref 6.0–8.3)
Total Bilirubin: 0.5 mg/dL (ref 0.3–1.2)

## 2013-08-30 LAB — CBC WITH DIFFERENTIAL/PLATELET
BASOS ABS: 0 10*3/uL (ref 0.0–0.1)
Basophils Relative: 0.2 % (ref 0.0–3.0)
EOS PCT: 7.2 % — AB (ref 0.0–5.0)
Eosinophils Absolute: 0.4 10*3/uL (ref 0.0–0.7)
HCT: 39.8 % (ref 36.0–46.0)
Hemoglobin: 12.8 g/dL (ref 12.0–15.0)
LYMPHS PCT: 25.3 % (ref 12.0–46.0)
Lymphs Abs: 1.5 10*3/uL (ref 0.7–4.0)
MCHC: 32.3 g/dL (ref 30.0–36.0)
MCV: 76.5 fl — ABNORMAL LOW (ref 78.0–100.0)
MONOS PCT: 6.4 % (ref 3.0–12.0)
Monocytes Absolute: 0.4 10*3/uL (ref 0.1–1.0)
Neutro Abs: 3.7 10*3/uL (ref 1.4–7.7)
Neutrophils Relative %: 60.9 % (ref 43.0–77.0)
Platelets: 321 10*3/uL (ref 150.0–400.0)
RBC: 5.2 Mil/uL — ABNORMAL HIGH (ref 3.87–5.11)
RDW: 18 % — AB (ref 11.5–14.6)
WBC: 6.1 10*3/uL (ref 4.5–10.5)

## 2013-08-30 LAB — LIPID PANEL
Cholesterol: 210 mg/dL — ABNORMAL HIGH (ref 0–200)
HDL: 95.6 mg/dL (ref >39.00)
LDL Cholesterol: 91 mg/dL (ref 0–99)
Total CHOL/HDL Ratio: 2
Triglycerides: 117 mg/dL (ref 0.0–149.0)
VLDL: 23.4 mg/dL (ref 0.0–40.0)

## 2013-08-30 LAB — TSH: TSH: 0.95 u[IU]/mL (ref 0.35–5.50)

## 2013-08-30 MED ORDER — ZOLPIDEM TARTRATE 5 MG PO TABS: 5.0000 mg | ORAL_TABLET | Freq: Every evening | ORAL | Status: DC | PRN

## 2013-08-30 MED ORDER — BENAZEPRIL HCL 40 MG PO TABS: ORAL_TABLET | ORAL | Status: DC

## 2013-08-30 MED ORDER — AMPHETAMINE-DEXTROAMPHETAMINE 30 MG PO TABS: 30.0000 mg | ORAL_TABLET | Freq: Every day | ORAL | Status: DC

## 2013-08-30 NOTE — Assessment & Plan Note (Signed)
On adderall.  Managed by psych.

## 2013-08-30 NOTE — Assessment & Plan Note (Signed)
>  25 min spent with patient, at least half of which was spent on counseling insomnia.  The problem of recurrent insomnia is discussed. Avoidance of caffeine sources is strongly encouraged. Sleep hygiene issues are reviewed. The use of sedative hypnotics for temporary relief is appropriate; we discussed the addictive nature of these drugs, and a one-time only prescription for prn use of a hypnotic is given, to use no more than 3 times per week for 2-3 weeks.  Rx given for ambien 5 mg daily prn.

## 2013-08-30 NOTE — Progress Notes (Signed)
Pre visit review using our clinic review tool, if applicable. No additional management support is needed unless otherwise documented below in the visit note. 

## 2013-08-30 NOTE — Telephone Encounter (Signed)
Relevant patient education assigned to patient using Emmi. ° °

## 2013-08-30 NOTE — Assessment & Plan Note (Signed)
Reasonable control. No changes.

## 2013-08-30 NOTE — Assessment & Plan Note (Signed)
Pap smear done today

## 2013-08-30 NOTE — Assessment & Plan Note (Addendum)
Reviewed preventive care protocols, scheduled due services, and updated immunizations Discussed nutrition, exercise, diet, and healthy lifestyle.  Mammogram ordered.  She will call to set up.  She will call us with name of her gastroenterologist so we can get copies of her colonoscopy.  Labs today.  Orders Placed This Encounter  Procedures  . MM Digital Screening  . CBC with Differential  . Comprehensive metabolic panel  . Lipid panel  . TSH  . HIV Antibody  . RPR  . Ambulatory referral to Dermatology

## 2013-08-30 NOTE — Assessment & Plan Note (Signed)
Refer to derm for biopsy as area has never been biopsied.
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
## 2013-08-30 NOTE — Patient Instructions (Addendum)
Great to see you. Please call me with the name of your gastroenterologist who did your colonoscopy.  Please call to set up your mammogram.  We will call you with your lab results and your dermatology referral.

## 2013-08-31 LAB — HIV ANTIBODY (ROUTINE TESTING W REFLEX): HIV: NONREACTIVE

## 2013-08-31 LAB — RPR

## 2013-09-03 ENCOUNTER — Other Ambulatory Visit: Payer: Self-pay | Admitting: Family Medicine

## 2013-09-03 DIAGNOSIS — IMO0002 Reserved for concepts with insufficient information to code with codable children: Secondary | ICD-10-CM

## 2013-09-04 LAB — CERVICOVAGINAL ANCILLARY ONLY
Bacterial vaginitis: NEGATIVE
Candida vaginitis: NEGATIVE
HERPES (WINDOWPATH): NEGATIVE

## 2013-09-28 ENCOUNTER — Encounter: Payer: 59 | Admitting: Obstetrics & Gynecology

## 2013-10-02 ENCOUNTER — Ambulatory Visit (INDEPENDENT_AMBULATORY_CARE_PROVIDER_SITE_OTHER): Payer: 59 | Admitting: Family Medicine

## 2013-10-02 ENCOUNTER — Encounter: Payer: 59 | Admitting: Obstetrics & Gynecology

## 2013-10-02 ENCOUNTER — Encounter: Payer: Self-pay | Admitting: Family Medicine

## 2013-10-02 VITALS — BP 134/86 | HR 85 | Temp 98.6°F | Ht 62.0 in | Wt 130.0 lb

## 2013-10-02 DIAGNOSIS — N390 Urinary tract infection, site not specified: Secondary | ICD-10-CM

## 2013-10-02 DIAGNOSIS — R300 Dysuria: Secondary | ICD-10-CM

## 2013-10-02 LAB — POCT URINALYSIS DIPSTICK
Bilirubin, UA: NEGATIVE
Glucose, UA: NEGATIVE
KETONES UA: NEGATIVE
Nitrite, UA: NEGATIVE
PH UA: 7
Spec Grav, UA: 1.01
Urobilinogen, UA: 0.2

## 2013-10-02 MED ORDER — CIPROFLOXACIN HCL 250 MG PO TABS: 250.0000 mg | ORAL_TABLET | Freq: Two times a day (BID) | ORAL | Status: DC

## 2013-10-02 NOTE — Progress Notes (Signed)
Pre visit review using our clinic review tool, if applicable. No additional management support is needed unless otherwise documented below in the visit note. 

## 2013-10-02 NOTE — Patient Instructions (Signed)
Drink lots of water  Take cipro as directed  We will call about your culture results  Please stop up front to un enroll in mychart (or stay on it but -make sure you talk to them anyway about the problems you are having)
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
## 2013-10-03 LAB — POCT UA - MICROSCOPIC ONLY
Casts, Ur, LPF, POC: 0
Crystals, Ur, HPF, POC: 0
YEAST UA: 0

## 2013-10-03 NOTE — Assessment & Plan Note (Signed)
Cover with cipro  Inc water intake  cx and update Update if not starting to improve in a week or if worsening

## 2013-10-04 LAB — URINE CULTURE: Colony Count: 30000

## 2013-11-19 ENCOUNTER — Ambulatory Visit: Payer: 59 | Admitting: Family Medicine

## 2013-11-21 ENCOUNTER — Ambulatory Visit (INDEPENDENT_AMBULATORY_CARE_PROVIDER_SITE_OTHER): Payer: 59 | Admitting: Family Medicine

## 2013-11-21 ENCOUNTER — Encounter: Payer: Self-pay | Admitting: Family Medicine

## 2013-11-21 VITALS — BP 132/72 | HR 70 | Temp 98.2°F | Wt 128.5 lb

## 2013-11-21 DIAGNOSIS — M21619 Bunion of unspecified foot: Secondary | ICD-10-CM

## 2013-11-21 DIAGNOSIS — M7711 Lateral epicondylitis, right elbow: Secondary | ICD-10-CM

## 2013-11-21 DIAGNOSIS — M21611 Bunion of right foot: Secondary | ICD-10-CM | POA: Insufficient documentation

## 2013-11-21 DIAGNOSIS — M7710 Lateral epicondylitis, unspecified elbow: Secondary | ICD-10-CM

## 2013-11-21 DIAGNOSIS — M21612 Bunion of left foot: Secondary | ICD-10-CM

## 2013-11-21 MED ORDER — PREDNISONE 20 MG PO TABS: ORAL_TABLET | ORAL | Status: DC

## 2013-11-21 NOTE — Assessment & Plan Note (Signed)
Deteriorated. Offered new elbow strap today, she declined. Course of prednisone to decrease inflammation. Follow up with Dr. Lorelei Pont in 2-3 weeks. Also given exercises from sports medicine advisor.

## 2013-11-21 NOTE — Progress Notes (Signed)
Pre visit review using our clinic review tool, if applicable. No additional management support is needed unless otherwise documented below in the visit note. 
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
## 2013-11-21 NOTE — Assessment & Plan Note (Signed)
Refer to podiatry. Orders Placed This Encounter  Procedures  . Ambulatory referral to Podiatry

## 2013-11-21 NOTE — Patient Instructions (Signed)
Great to see you. Please take prednisone as directed and with food. Make an appointment to see Dr. Lorelei Pont in 2- 3 weeks.

## 2013-11-26 ENCOUNTER — Encounter: Payer: Self-pay | Admitting: Podiatry

## 2013-11-26 ENCOUNTER — Ambulatory Visit (INDEPENDENT_AMBULATORY_CARE_PROVIDER_SITE_OTHER): Payer: 59 | Admitting: Podiatry

## 2013-11-26 ENCOUNTER — Ambulatory Visit (INDEPENDENT_AMBULATORY_CARE_PROVIDER_SITE_OTHER): Payer: 59

## 2013-11-26 VITALS — BP 141/89 | HR 82 | Resp 16 | Ht 62.0 in | Wt 126.0 lb

## 2013-11-26 DIAGNOSIS — L259 Unspecified contact dermatitis, unspecified cause: Secondary | ICD-10-CM

## 2013-11-26 DIAGNOSIS — M2010 Hallux valgus (acquired), unspecified foot: Secondary | ICD-10-CM

## 2013-11-26 DIAGNOSIS — M898X9 Other specified disorders of bone, unspecified site: Secondary | ICD-10-CM

## 2013-11-26 NOTE — Progress Notes (Signed)
   Subjective:    Patient ID: Brittany Hess, female    DOB: [birth year], 53 y.o.   MRN: [id number]  HPI Comments: "I have some pain in the big toes"  Patient c/o aching 1st toes bilateral. The 1st toe left has dorsal knot that is getting larger. This area has ben there since 2011. The 1st toe right has callused area medial side. This area has only been there for a few months. She states both spots rub on shoes and is uncomfortable to walk. She has tried padding the toes for comfort but no help.  Toe Pain  Associated symptoms include numbness.      Review of Systems  Respiratory: Positive for cough.   Musculoskeletal: Positive for myalgias.  Skin: Positive for color change.       Change in nails Thick scars  Allergic/Immunologic: Positive for environmental allergies.  Neurological: Positive for numbness.  All other systems reviewed and are negative.      Objective:   Physical Exam: I have reviewed her past medical history medications allergies surgeries social history and review of systems. Additional information that she neglected to tell us early on was the fact that she has pitting of her fingernails a rash that occurs over her upper lip and face that it itches. Pulses are strongly palpable bilateral. Neurologic sensorium is intact per since once the monofilament deep tendon reflexes are intact bilateral muscle strength is 5 over 5 dorsiflexors plantar flexors inverters everters all intrinsic musculature is intact. Orthopedic evaluation demonstrates all joints distal to the ankle have a full range of motion without crepitation. Cutaneous evaluation demonstrates supple well hydrated cutis she does have mild reactive hyperkeratosis to the dorsal medial IP joint of the hallux bilaterally associated with underlying bone spurs demonstrated on radiograph. Radiographic evaluation otherwise normal. Cutaneous evaluation does demonstrate a very thick epidermal layer to the plantar aspect of the  bilateral foot which she is to trim all-time. Her nail plates do demonstrate lines thickening pitting.        Assessment & Plan:  Assessment: Exostosis with reactive hyperkeratosis hallux bilateral. 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  Plan: Discussed etiology pathology conservative versus surgical therapies. At some point we will perform surgery to fix the toes however I did suggest that she followup with dermatology to evaluate all these rashes nail changes and hyperkeratotic tissue to the plantar aspect the bilateral foot.

## 2013-12-12 ENCOUNTER — Ambulatory Visit (INDEPENDENT_AMBULATORY_CARE_PROVIDER_SITE_OTHER): Payer: 59 | Admitting: Family Medicine

## 2013-12-12 ENCOUNTER — Encounter: Payer: Self-pay | Admitting: Family Medicine

## 2013-12-12 VITALS — BP 148/96 | HR 83 | Temp 98.7°F | Ht 62.0 in | Wt 133.0 lb

## 2013-12-12 DIAGNOSIS — M7710 Lateral epicondylitis, unspecified elbow: Secondary | ICD-10-CM

## 2013-12-12 DIAGNOSIS — M7711 Lateral epicondylitis, right elbow: Secondary | ICD-10-CM

## 2013-12-12 MED ORDER — AMLODIPINE BESYLATE 5 MG PO TABS: ORAL_TABLET | ORAL | Status: DC

## 2013-12-12 NOTE — Progress Notes (Signed)
Pre visit review using our clinic review tool, if applicable. No additional management support is needed unless otherwise documented below in the visit note. 

## 2013-12-12 NOTE — Progress Notes (Signed)
[place] [address] Phone: [phone] Fax: [phone]  Patient ID: Brittany Hess MRN: [id number], DOB: 05-11-1961, 53 y.o. Date of Encounter: 12/12/2013  Primary Physician:  Arnette Norris, MD   Chief Complaint: Tennis Elbow   Subjective:   History of Present Illness:  Dr. Deborra Medina refers patient presents for evaluation of RIGHT lateral elbow pain.  Length of symptoms: 7 months Hand effected: RIGHT  Patient describes a dull ache on the lateral elbow. There is some translation in the proximal forearm and in the distal upper arm. It is painful to lift with the hand facing down and to lift with the thumb in an upright position. Supination is painful. Patient points to the lateral epicondyle as the point of maximal tenderness near ECRB.  No trauma.   Elbow has been hurting since January, and ten starting in April started to get a lot worse.  Earlier in the year, she was on a table and then thinks that it flared up then.   No prior fractures or operative interventions in the effective hand. Prior PT or HEP: intermittent flexion and extension.  Occ tingling. Isolated to elbow No significant neck or shoulder pain.  Hand of dominance: LEFT  The PMH, PSH, Social History, Family History, Medications, and allergies have been reviewed in [facility], and have been updated if relevant.   REVIEW OF SYSTEMS  GEN: No fevers, chills. Nontoxic. Primarily MSK c/o today. MSK: Detailed in the HPI GI: tolerating PO intake without difficulty Neuro: No numbness, parasthesias, or tingling associated. Otherwise the pertinent positives of the ROS are noted above.    Objective:   PHYSICAL EXAM Blood pressure 148/96, pulse 83, temperature 98.7 F (37.1 C), temperature source Oral, height 5\' 2"  (1.575 m), weight 133 lb (60.328 kg), last menstrual period 11/13/2013.  GEN: Well-developed,well-nourished,in no acute distress; alert,appropriate and cooperative throughout examination HEENT:  Normocephalic and atraumatic without obvious abnormalities. Ears, externally no deformities PULM: Breathing comfortably in no respiratory distress EXT: No clubbing, cyanosis, or edema PSYCH: Normally interactive. Cooperative during the interview. Pleasant. Friendly and conversant. Not anxious or depressed appearing. Normal, full affect.  R elbow Ecchymosis or edema: neg ROM: full flexion, extension, pronation, supination Shoulder ROM: Full Flexion: 5/5 Extension: 5/5, PAINFUL Supination: 5/5, PAINFUL Pronation: 5/5 Wrist ext: 5/5 Wrist flexion: 5/5 No gross bony abnormality Varus and Valgus stress: stable ECRB tenderness: YES, TTP Medial epicondyle: NT Lateral epicondyle, resisted wrist extension from wrist full pronation and flexion: PAINFUL grip: 5/5  sensation intact Tinel's, Elbow: negative   Assessment & Plan:   Lateral Epicondylitis:  Elbow anatomy was reviewed, and tendinopathy was explained.  Pt. given a formal rehab program from [facility] on elbow rehabiliation.  Series of concentric and eccentric exercises should be done starting with no weight, work up to 1 lb, hammer, etc.  Use counterforce strap if working or using hands.  Formal PT would be beneficial. Emphasized stretching an cross-friction massage Emphasized proper palms up lifting biomechanics to unload ECRB  Lateral Epicondylitis Injection, R Verbal consent was obtained from the patient. Risks, benefits, and alternatives were discussed. Potential complications including loss of pigment, atrophy, and rare risk of infection were discussed. Prepped with Chloraprep and Ethyl Chloride used for anesthesia. Under sterile conditions, the patient was injected at the point of maximal tenderness at the ECRB tendon with 1/2 cc of Lidocaine 1% and Depo-Medrol 20 mg. Decreased pain after injection. No complications.  Needle size: 22 gauge 1 1/2 inch   Orders Placed This  Encounter  Procedures  . Ambulatory referral  to Physical Therapy   Follow-up: Return in about 6 weeks (around 01/23/2014). Unless noted above, the patient is to follow-up if symptoms worsen. Red flags were reviewed with the patient.  Signed,  Maud Deed. Andrya Roppolo, MD, CA[facility] Sports Medicine   Discontinued Medications   PREDNISONE (DELTASONE) 20 MG TABLET    2 tabs by mouth every morning x 7 days, then 1 tab by mouth x 2 days, then 1/2 tab by mouth x 2 days and stop Dispense qs   Current Medications at Discharge:   Medication List       This list is accurate as of: 12/12/13  2:01 PM.  Always use your most recent med list.               amLODipine 5 MG tablet  Commonly known as:  NORVASC  TAKE 1 TABLET BY MOUTH DAILY.     benazepril 40 MG tablet  Commonly known as:  LOTENSIN  TAKE 1 TABLET BY MOUTH EVERY DAY     zolpidem 5 MG tablet  Commonly known as:  AMBIEN  Take 1 tablet (5 mg total) by mouth at bedtime as needed for sleep.

## 2013-12-12 NOTE — Patient Instructions (Signed)
REFERRALS TO SPECIALISTS, SPECIAL TESTS (MRI, CT, ULTRASOUNDS)  GO THE WAITING ROOM AND TELL CHECK IN YOU NEED HELP WITH A REFERRAL. Either [facility] or LINDA will help you set it up.  If it is between 1-2 PM they may be at lunch.  After 5 PM, they will likely be at home.  They will call you, so please make sure the office has your correct phone number.  Referrals sometimes can be done same day if urgent, but others can take 2 or 3 days to get an appointment. Starting in 2015, many of the new Medicare insurance plans and Affordable Care Act (Obamacare) Health plans offered take much longer for referrals. They have added additional paperwork and steps.  MRI's and CT's can take up to a week for the test. (Emergencies like strokes take precedence. I will tell you if you have an emergency.)   If your referral is to an in-network [facility] office, their office may contact you directly prior to our office reaching you.  -- Examples: [facility], [facility], [facility], [facility], [facility], and many more.  Specialist appointment times vary a great deal, mostly on the specialist's schedule and if they have openings. -- Our office tries to get you in as fast as possible. -- Some specialists have very long wait times. (Example. Dermatology. Usually months) -- If you have a true emergency like new cancer, we work to get you in ASAP.   

## 2014-01-23 ENCOUNTER — Encounter: Payer: Self-pay | Admitting: Family Medicine

## 2014-01-23 ENCOUNTER — Ambulatory Visit (INDEPENDENT_AMBULATORY_CARE_PROVIDER_SITE_OTHER): Payer: 59 | Admitting: Family Medicine

## 2014-01-23 VITALS — BP 123/84 | HR 72 | Temp 98.4°F | Ht 62.0 in | Wt 132.8 lb

## 2014-01-23 DIAGNOSIS — R059 Cough, unspecified: Secondary | ICD-10-CM

## 2014-01-23 DIAGNOSIS — R05 Cough: Secondary | ICD-10-CM

## 2014-01-23 DIAGNOSIS — M7711 Lateral epicondylitis, right elbow: Secondary | ICD-10-CM

## 2014-01-23 DIAGNOSIS — M7710 Lateral epicondylitis, unspecified elbow: Secondary | ICD-10-CM

## 2014-01-23 MED ORDER — AZITHROMYCIN 250 MG PO TABS: ORAL_TABLET | ORAL | Status: DC

## 2014-01-23 MED ORDER — FLUTICASONE PROPIONATE 50 MCG/ACT NA SUSP: 2.0000 | Freq: Every day | NASAL | Status: DC

## 2014-01-23 NOTE — Progress Notes (Signed)
Pre visit review using our clinic review tool, if applicable. No additional management support is needed unless otherwise documented below in the visit note. 

## 2014-01-23 NOTE — Progress Notes (Addendum)
[place] [address] Phone: [phone] Fax: [phone]  Patient ID: Brittany Hess MRN: [id number], DOB: 1961/04/26, 53 y.o. Date of Encounter: 01/23/2014  Primary Physician:  Arnette Norris, MD   Chief Complaint: Follow-up   Subjective:   6 weeks post inj, post-Grashey technique and PT, doing great. NT. No probs.  12/12/2013 Last OV with Owens Loffler, MD  Dr. Deborra Medina refers patient presents for evaluation of RIGHT lateral elbow pain.  Length of symptoms: 7 months Hand effected: RIGHT  Patient describes a dull ache on the lateral elbow. There is some translation in the proximal forearm and in the distal upper arm. It is painful to lift with the hand facing down and to lift with the thumb in an upright position. Supination is painful. Patient points to the lateral epicondyle as the point of maximal tenderness near ECRB.  No trauma.   Elbow has been hurting since January, and ten starting in April started to get a lot worse.  Earlier in the year, she was on a table and then thinks that it flared up then.   No prior fractures or operative interventions in the effective hand. Prior PT or HEP: intermittent flexion and extension.  Occ tingling. Isolated to elbow No significant neck or shoulder pain.  Hand of dominance: LEFT  The PMH, PSH, Social History, Family History, Medications, and allergies have been reviewed in [facility], and have been updated if relevant.   REVIEW OF SYSTEMS  GEN: No fevers, chills. Nontoxic. Primarily MSK c/o today. MSK: Detailed in the HPI GI: tolerating PO intake without difficulty Neuro: No numbness, parasthesias, or tingling associated. Otherwise the pertinent positives of the ROS are noted above.    Objective:   PHYSICAL EXAM Blood pressure 123/84, pulse 72, temperature 98.4 F (36.9 C), temperature source Oral, height 5\' 2"  (1.575 m), weight 132 lb 12 oz (60.215 kg), SpO2 98.00%.   GEN: WDWN, NAD, Non-toxic, Alert & Oriented x  3 HEENT: Atraumatic, Normocephalic.  Ears and Nose: No external deformity. EXTR: No clubbing/cyanosis/edema NEURO: Normal gait.  PSYCH: Normally interactive. Conversant. Not depressed or anxious appearing.  Calm demeanor.   Elbow: R Ecchymosis or edema: neg ROM: full flexion, extension, pronation, supination Shoulder ROM: Full Flexion: 5/5 Extension: 5/5, PAINFUL Supination: 5/5, PAINFUL Pronation: 5/5 Wrist ext: 5/5 Wrist flexion: 5/5 No gross bony abnormality Varus and Valgus stress: stable ECRB tenderness: YES, TTP Medial epicondyle: NT Lateral epicondyle, resisted wrist extension from wrist full pronation and flexion: PAINFUL grip: 5/5  sensation intact Tinel's, Elbow: negative  Assessment & Plan:   Lateral epicondylitis, right  Cough  LE doing great  Chronic cough most likely post-nasal, cannot exclude walking PNA. Treat as such, f/u PCP if not improving in 3-4 weeks  Follow-up: No Follow-up on file.  New Prescriptions   AZITHROMYCIN (ZITHROMAX Z-PAK) 250 MG TABLET    Take 2 tablets (500 mg) on  Day 1,  followed by 1 tablet (250 mg) once daily on Days 2 through 5.   FLUTICASONE (FLONASE) 50 MCG/ACT NASAL SPRAY    Place 2 sprays into both nostrils daily.   No orders of the defined types were placed in this encounter.    Signed,  Maud Deed. [place] Dube, MD   Patient's Medications  New Prescriptions   AZITHROMYCIN (ZITHROMAX Z-PAK) 250 MG TABLET    Take 2 tablets (500 mg) on  Day 1,  followed by 1 tablet (250 mg) once daily on Days 2 through 5.   FLUTICASONE (FLONASE)  50 MCG/ACT NASAL SPRAY    Place 2 sprays into both nostrils daily.  Previous Medications   AMLODIPINE (NORVASC) 5 MG TABLET    TAKE 1 TABLET BY MOUTH DAILY.   BENAZEPRIL (LOTENSIN) 40 MG TABLET    TAKE 1 TABLET BY MOUTH EVERY DAY  Modified Medications   No medications on file  Discontinued Medications   ZOLPIDEM (AMBIEN) 5 MG TABLET    Take 1 tablet (5 mg total) by mouth at bedtime as needed  for sleep.

## 2014-01-23 NOTE — Addendum Note (Signed)
Addended by: Owens Loffler on: 01/23/2014 08:29 AM   Modules accepted: Orders

## 2014-01-23 NOTE — Patient Instructions (Signed)
ALLERGIES -Sneezing, runny and stuffy nose, itchy eyes, nose, throat, tears.  TREATMENT 1. Avoid offending allergen 2. Air filtration devices can reduce concentration of allergen 3. Intranasal steroid sprays are the most effective control (Flonase and Nasacort are over the counter now.) 4. Anti-histamines. Claritin (generic loratadine), Zyrtec (generic certrizine), and Allegra (fexofenadine) are all over the counter now. Cheap at [employer], [employer], [employer]. Pick 1 - sometimes it is helpful to try and see which one helps you the most or vary it up some over time. 5. You can also do an extra tablet of Benadryl 25 mg at night if needed. It makes you somewhat drowsy. 6. Presciption anti-histamines are available if the above fail  Don't use nasal AFRIN or NEOSYNEPHRINE for an extended time.

## 2014-01-23 NOTE — Addendum Note (Signed)
Addended by: Owens Loffler on: 01/23/2014 08:36 AM   Modules accepted: Level of Service

## 2014-02-07 ENCOUNTER — Ambulatory Visit (INDEPENDENT_AMBULATORY_CARE_PROVIDER_SITE_OTHER)
Admission: RE | Admit: 2014-02-07 | Discharge: 2014-02-07 | Disposition: A | Discharge status: Home / Self Care | Payer: 59 | Source: Ambulatory Visit | Attending: Family Medicine | Admitting: Family Medicine

## 2014-02-07 ENCOUNTER — Ambulatory Visit (INDEPENDENT_AMBULATORY_CARE_PROVIDER_SITE_OTHER): Payer: 59 | Admitting: Family Medicine

## 2014-02-07 ENCOUNTER — Encounter: Payer: Self-pay | Admitting: Family Medicine

## 2014-02-07 ENCOUNTER — Ambulatory Visit
Admission: RE | Admit: 2014-02-07 | Discharge: 2014-02-07 | Disposition: A | Discharge status: Home / Self Care | Payer: 59 | Source: Ambulatory Visit | Attending: Family Medicine | Admitting: Family Medicine

## 2014-02-07 VITALS — BP 110/72 | HR 83 | Temp 98.9°F | Ht 62.0 in | Wt 131.5 lb

## 2014-02-07 DIAGNOSIS — M25562 Pain in left knee: Principal | ICD-10-CM

## 2014-02-07 DIAGNOSIS — R05 Cough: Secondary | ICD-10-CM

## 2014-02-07 DIAGNOSIS — M25569 Pain in unspecified knee: Secondary | ICD-10-CM

## 2014-02-07 DIAGNOSIS — M629 Disorder of muscle, unspecified: Secondary | ICD-10-CM

## 2014-02-07 DIAGNOSIS — J302 Other seasonal allergic rhinitis: Secondary | ICD-10-CM

## 2014-02-07 DIAGNOSIS — J3089 Other allergic rhinitis: Secondary | ICD-10-CM

## 2014-02-07 DIAGNOSIS — R059 Cough, unspecified: Secondary | ICD-10-CM

## 2014-02-07 DIAGNOSIS — M25561 Pain in right knee: Secondary | ICD-10-CM

## 2014-02-07 DIAGNOSIS — M222X9 Patellofemoral disorders, unspecified knee: Secondary | ICD-10-CM

## 2014-02-07 DIAGNOSIS — M7631 Iliotibial band syndrome, right leg: Secondary | ICD-10-CM

## 2014-02-07 MED ORDER — MONTELUKAST SODIUM 10 MG PO TABS: 10.0000 mg | ORAL_TABLET | Freq: Every day | ORAL | Status: DC

## 2014-02-07 NOTE — Progress Notes (Signed)
Dr. Frederico Hamman T. Feige Lowdermilk, MD, [facility] [place], [address] Phone: [phone] Fax: [phone]  02/07/2014  Patient: Brittany Hess, MRN: [id number], DOB: April 29, 1961, 53 y.o.  Primary Physician:  Arnette Norris, MD  Chief Complaint: Knee Pain, Cough and Abdominal Pain  Subjective:   Brittany Hess is a 53 y.o. very pleasant female patient who presents with a multitude of c/o:  Patient presents with 1-2 month h/o R>L sided knee pain after running and getting up and down. No audible pop was heard. The patient has not had an effusion - or minimal. No symptomatic giving-way. No mechanical clicking. Joint has not locked up. Patient has been able to walk but has had trouble resuming running. The patient does not have pain going up and down stairs or rising from a seated position.   Pain location: "inside", antero-medial Current physical activity: minimal, prior runner Prior Knee Surgery: none Current pain meds: none, rare nsaid Bracing: none  She had been running 2 miles a day and stopped running for 7 months and started back to running, back up to her former running level in less than 2 weeks.   Chronic cough, s/p zpak that I gave a few weeks ago, taking flonase, not any other allergy medications, and she denies GERD or any heartburn.  Past Medical History, Surgical History, Social History, Family History, Problem List, Medications, and Allergies have been reviewed and updated if relevant.  GEN: No fevers, chills. Nontoxic. Primarily MSK c/o today. MSK: Detailed in the HPI GI: tolerating PO intake without difficulty Neuro: No numbness, parasthesias, or tingling associated. Otherwise the pertinent positives of the ROS are noted above.   Objective:   BP 110/72  Pulse 83  Temp(Src) 98.9 F (37.2 C) (Oral)  Ht 5\' 2"  (1.575 m)  Wt 131 lb 8 oz (59.648 kg)  BMI 24.05 kg/m2  SpO2 99%   GEN: WDWN, NAD, Non-toxic, Alert &  Oriented x 3 HEENT: Atraumatic, Normocephalic.  Ears and Nose: No external deformity. PULM: CTAB EXTR: No clubbing/cyanosis/edema NEURO: Normal gait.  PSYCH: Normally interactive. Conversant. Not depressed or anxious appearing.  Calm demeanor.   Knee:  B Gait: Normal heel toe pattern ROM: 0-130 Effusion: neg Echymosis or edema: none Patellar tendon NT Painful PLICA: neg Patellar grind: mild POS Medial and lateral patellar facet loading: TTP R medial and lateral joint lines:NT Mcmurray's neg Flexion-pinch neg Varus and valgus stress: mild pain with varus stress IT band ttp distally Lachman: neg Ant and Post drawer: neg Hip abduction, IR, ER: WNL Hip flexion str: 5/5 Hip abd: 5/5 Quad: 5/5 VMO atrophy:No Hamstring concentric and eccentric: 5/5   Radiology: X-rays: AP Weight-bearing, Weightbearing Lateral, Sunrise views Indication: knee pain, R Findings:  There is no evidence for fracture or dislocation.Normal alignment. Minimal to no significant osteoarthritic changes.  X-rays: AP WB,  Weightbearing Lateral, Sunrise views Indication: knee pain LEFT Findings:  Here is no fracture or dislocation. Normal alignment. Minimal no significant osteoarthritic changes are present. Electronically Signed  By: Owens Loffler, MD On: 02/08/2014 8:36 AM   Assessment and Plan:   Bilateral knee pain - Plan: DG Knee AP/LAT W/Sunrise Left, DG Knee AP/LAT W/Sunrise Right, CANCELED: DG Knee 4 Views W/Patella Right  Patellofemoral pain syndrome, unspecified laterality  Iliotibial band syndrome, right  Other seasonal allergic rhinitis  Cough  I think the patient tried to return to running too aggressively. She has aggravated her patellofemoral joint, and she has some mild  ITB syndrome. She also has some pain with varus stress, but her lateral collateral ligament is completely intact. Doubtful that she has injured her lateral collateral ligament, and is mostly likely represents stress at  the ITB.  Reviewed comprehensive running return to running program from [place]. I think if she does this, she will do very well. Also recommended doing some anti-inflammatories for the next 2 weeks and icing after work.  Chronic cough is another challenging problem. She has been treated with Zithromax, to cover potential atypical infections as well aspertussis.I reviewed sometimes even if these have been treated he will have persistent cough afterwards for a long time.She may well be having postnasal drip, and we are going to try to aggressively treat her ALLERGIES to see if this helps with her cough.  Also reviewed that cough-variant asthma and gastroesophageal reflux disease are extremely common causes of chronic cough, but she does not think she has either of these.   Follow-up: No Follow-up on file.  New Prescriptions   MONTELUKAST (SINGULAIR) 10 MG TABLET    Take 1 tablet (10 mg total) by mouth at bedtime.   Orders Placed This Encounter  Procedures  . DG Knee AP/LAT W/Sunrise Left  . DG Knee AP/LAT W/Sunrise Right    Signed,  Lucille Crichlow T. Tammra Pressman, MD   Patient's Medications  New Prescriptions   MONTELUKAST (SINGULAIR) 10 MG TABLET    Take 1 tablet (10 mg total) by mouth at bedtime.  Previous Medications   AMLODIPINE (NORVASC) 5 MG TABLET    TAKE 1 TABLET BY MOUTH DAILY.   BENAZEPRIL (LOTENSIN) 40 MG TABLET    TAKE 1 TABLET BY MOUTH EVERY DAY   FLUTICASONE (FLONASE) 50 MCG/ACT NASAL SPRAY    Place 2 sprays into both nostrils daily.  Modified Medications   No medications on file  Discontinued Medications   AZITHROMYCIN (ZITHROMAX Z-PAK) 250 MG TABLET    Take 2 tablets (500 mg) on  Day 1,  followed by 1 tablet (250 mg) once daily on Days 2 through 5.

## 2014-02-07 NOTE — Patient Instructions (Signed)
ALLERGIES -Sneezing, runny and stuffy nose, itchy eyes, nose, throat, tears.  TREATMENT 1. Avoid offending allergen 2. Air filtration devices can reduce concentration of allergen 3. Intranasal steroid sprays are the most effective control (Flonase and Nasacort are over the counter now.) 4. Anti-histamines. Claritin (generic loratadine), Zyrtec (generic certrizine), and Allegra (fexofenadine) are all over the counter now. Cheap at [employer], [employer], [employer]. Pick 1 - sometimes it is helpful to try and see which one helps you the most or vary it up some over time. 5. You can also do an extra tablet of Benadryl 25 mg at night if needed. It makes you somewhat drowsy. 6. Presciption anti-histamines are available if the above fail

## 2014-02-07 NOTE — Progress Notes (Signed)
Pre visit review using our clinic review tool, if applicable. No additional management support is needed unless otherwise documented below in the visit note. 

## 2014-02-20 ENCOUNTER — Other Ambulatory Visit: Payer: Self-pay

## 2014-02-20 DIAGNOSIS — Z1231 Encounter for screening mammogram for malignant neoplasm of breast: Secondary | ICD-10-CM

## 2014-02-21 ENCOUNTER — Other Ambulatory Visit: Payer: Self-pay | Admitting: *Deleted

## 2014-02-21 MED ORDER — BENAZEPRIL HCL 40 MG PO TABS: ORAL_TABLET | ORAL | Status: DC

## 2014-02-27 ENCOUNTER — Ambulatory Visit: Payer: Self-pay

## 2014-02-28 ENCOUNTER — Ambulatory Visit
Admission: RE | Admit: 2014-02-28 | Discharge: 2014-02-28 | Disposition: A | Discharge status: Home / Self Care | Payer: 59 | Source: Ambulatory Visit

## 2014-02-28 DIAGNOSIS — Z1231 Encounter for screening mammogram for malignant neoplasm of breast: Secondary | ICD-10-CM

## 2014-05-08 ENCOUNTER — Ambulatory Visit (INDEPENDENT_AMBULATORY_CARE_PROVIDER_SITE_OTHER): Payer: 59 | Admitting: Family Medicine

## 2014-05-08 ENCOUNTER — Encounter: Payer: Self-pay | Admitting: Family Medicine

## 2014-05-08 VITALS — BP 140/78 | HR 80 | Temp 98.2°F | Ht 62.0 in | Wt 136.0 lb

## 2014-05-08 DIAGNOSIS — M7711 Lateral epicondylitis, right elbow: Secondary | ICD-10-CM

## 2014-05-08 DIAGNOSIS — M7710 Lateral epicondylitis, unspecified elbow: Secondary | ICD-10-CM

## 2014-05-08 MED ORDER — METHYLPREDNISOLONE ACETATE 40 MG/ML IJ SUSP
20.0000 mg | Freq: Once | INTRAMUSCULAR | Status: AC
  Administered 2014-05-08: 20 mg via INTRA_ARTICULAR

## 2014-05-08 NOTE — Progress Notes (Signed)
Pre visit review using our clinic review tool, if applicable. No additional management support is needed unless otherwise documented below in the visit note. 
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
## 2014-05-22 ENCOUNTER — Other Ambulatory Visit: Payer: Self-pay | Admitting: *Deleted

## 2014-05-22 MED ORDER — AMLODIPINE BESYLATE 5 MG PO TABS: ORAL_TABLET | ORAL | Status: DC

## 2014-05-22 MED ORDER — BENAZEPRIL HCL 40 MG PO TABS: ORAL_TABLET | ORAL | Status: DC

## 2014-07-30 ENCOUNTER — Other Ambulatory Visit: Payer: Self-pay | Admitting: Family Medicine

## 2014-08-01 ENCOUNTER — Other Ambulatory Visit: Payer: Self-pay | Admitting: Family Medicine

## 2014-09-02 ENCOUNTER — Other Ambulatory Visit: Payer: Self-pay | Admitting: Family Medicine

## 2014-09-24 ENCOUNTER — Other Ambulatory Visit: Payer: Self-pay | Admitting: Family Medicine

## 2014-09-24 ENCOUNTER — Other Ambulatory Visit (INDEPENDENT_AMBULATORY_CARE_PROVIDER_SITE_OTHER): Payer: BLUE CROSS/BLUE SHIELD

## 2014-09-24 DIAGNOSIS — D649 Anemia, unspecified: Secondary | ICD-10-CM

## 2014-09-24 DIAGNOSIS — Z0000 Encounter for general adult medical examination without abnormal findings: Secondary | ICD-10-CM

## 2014-09-24 LAB — COMPREHENSIVE METABOLIC PANEL
ALBUMIN: 3.9 g/dL (ref 3.5–5.2)
ALT: 13 U/L (ref 0–35)
AST: 17 U/L (ref 0–37)
Alkaline Phosphatase: 85 U/L (ref 39–117)
BUN: 10 mg/dL (ref 6–23)
CALCIUM: 10 mg/dL (ref 8.4–10.5)
CHLORIDE: 104 meq/L (ref 96–112)
CO2: 31 meq/L (ref 19–32)
Creatinine, Ser: 0.98 mg/dL (ref 0.40–1.20)
GFR: 62.9 mL/min (ref >60.00)
GLUCOSE: 97 mg/dL (ref 70–99)
POTASSIUM: 4.4 meq/L (ref 3.5–5.1)
SODIUM: 141 meq/L (ref 135–145)
TOTAL PROTEIN: 6.9 g/dL (ref 6.0–8.3)
Total Bilirubin: 0.7 mg/dL (ref 0.2–1.2)

## 2014-09-24 LAB — CBC WITH DIFFERENTIAL/PLATELET
BASOS ABS: 0 10*3/uL (ref 0.0–0.1)
BASOS PCT: 0.4 % (ref 0.0–3.0)
EOS ABS: 0.7 10*3/uL (ref 0.0–0.7)
Eosinophils Relative: 13.5 % — ABNORMAL HIGH (ref 0.0–5.0)
HCT: 37.6 % (ref 36.0–46.0)
Hemoglobin: 12.2 g/dL (ref 12.0–15.0)
LYMPHS PCT: 28.9 % (ref 12.0–46.0)
Lymphs Abs: 1.6 10*3/uL (ref 0.7–4.0)
MCHC: 32.5 g/dL (ref 30.0–36.0)
MCV: 75.5 fl — ABNORMAL LOW (ref 78.0–100.0)
Monocytes Absolute: 0.5 10*3/uL (ref 0.1–1.0)
Monocytes Relative: 9.3 % (ref 3.0–12.0)
NEUTROS PCT: 47.9 % (ref 43.0–77.0)
Neutro Abs: 2.6 10*3/uL (ref 1.4–7.7)
Platelets: 345 10*3/uL (ref 150.0–400.0)
RBC: 4.97 Mil/uL (ref 3.87–5.11)
RDW: 17 % — AB (ref 11.5–15.5)
WBC: 5.4 10*3/uL (ref 4.0–10.5)

## 2014-09-24 LAB — LIPID PANEL
Cholesterol: 224 mg/dL — ABNORMAL HIGH (ref 0–200)
HDL: 88.2 mg/dL (ref >39.00)
LDL CALC: 114 mg/dL — AB (ref 0–99)
NonHDL: 135.8
Total CHOL/HDL Ratio: 3
Triglycerides: 107 mg/dL (ref 0.0–149.0)
VLDL: 21.4 mg/dL (ref 0.0–40.0)

## 2014-09-24 LAB — TSH: TSH: 1.44 u[IU]/mL (ref 0.35–4.50)

## 2014-09-26 ENCOUNTER — Ambulatory Visit (INDEPENDENT_AMBULATORY_CARE_PROVIDER_SITE_OTHER): Payer: BLUE CROSS/BLUE SHIELD | Admitting: Family Medicine

## 2014-09-26 ENCOUNTER — Other Ambulatory Visit (HOSPITAL_COMMUNITY)
Admission: RE | Admit: 2014-09-26 | Discharge: 2014-09-26 | Disposition: A | Discharge status: Home / Self Care | Payer: BLUE CROSS/BLUE SHIELD | Source: Ambulatory Visit | Attending: Family Medicine | Admitting: Family Medicine

## 2014-09-26 ENCOUNTER — Encounter: Payer: Self-pay | Admitting: Family Medicine

## 2014-09-26 VITALS — BP 124/74 | HR 74 | Temp 97.9°F | Ht 62.5 in | Wt 135.8 lb

## 2014-09-26 DIAGNOSIS — Z0000 Encounter for general adult medical examination without abnormal findings: Secondary | ICD-10-CM | POA: Diagnosis not present

## 2014-09-26 DIAGNOSIS — R21 Rash and other nonspecific skin eruption: Secondary | ICD-10-CM | POA: Diagnosis not present

## 2014-09-26 DIAGNOSIS — R87811 Vaginal high risk human papillomavirus (HPV) DNA test positive: Secondary | ICD-10-CM

## 2014-09-26 DIAGNOSIS — Z114 Encounter for screening for human immunodeficiency virus [HIV]: Secondary | ICD-10-CM

## 2014-09-26 DIAGNOSIS — Z1151 Encounter for screening for human papillomavirus (HPV): Secondary | ICD-10-CM | POA: Diagnosis present

## 2014-09-26 DIAGNOSIS — D259 Leiomyoma of uterus, unspecified: Secondary | ICD-10-CM | POA: Diagnosis not present

## 2014-09-26 DIAGNOSIS — Z01419 Encounter for gynecological examination (general) (routine) without abnormal findings: Secondary | ICD-10-CM

## 2014-09-26 DIAGNOSIS — Z113 Encounter for screening for infections with a predominantly sexual mode of transmission: Secondary | ICD-10-CM | POA: Diagnosis not present

## 2014-09-26 DIAGNOSIS — N760 Acute vaginitis: Secondary | ICD-10-CM | POA: Insufficient documentation

## 2014-09-26 DIAGNOSIS — I10 Essential (primary) hypertension: Secondary | ICD-10-CM

## 2014-09-26 MED ORDER — BENAZEPRIL HCL 40 MG PO TABS: ORAL_TABLET | ORAL | Status: DC

## 2014-09-26 MED ORDER — AMLODIPINE BESYLATE 5 MG PO TABS: ORAL_TABLET | ORAL | Status: DC

## 2014-09-26 NOTE — Progress Notes (Signed)
Pre visit review using our clinic review tool, if applicable. No additional management support is needed unless otherwise documented below in the visit note. 

## 2014-09-26 NOTE — Assessment & Plan Note (Signed)
New- most consistent with pityriasis rosacea. Given handout discussed course and tx of this usually self limiting condition. Call or return to clinic prn if these symptoms worsen or fail to improve as anticipated. The patient indicates understanding of these issues and agrees with the plan.

## 2014-09-26 NOTE — Assessment & Plan Note (Signed)
Reviewed preventive care protocols, scheduled due services, and updated immunizations Discussed nutrition, exercise, diet, and healthy lifestyle.  

## 2014-09-26 NOTE — Assessment & Plan Note (Signed)
Desires STD testing, including HIV today. Denies any symptoms.

## 2014-09-26 NOTE — Addendum Note (Signed)
Addended by: Modena Nunnery on: 09/26/2014 09:20 AM   Modules accepted: Orders
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
## 2014-09-26 NOTE — Assessment & Plan Note (Signed)
Well controlled on current rx. No changes made. 

## 2014-09-26 NOTE — Patient Instructions (Signed)
Pityriasis Rosea  Pityriasis rosea is a rash which is probably caused by a virus. It generally starts as a scaly, red patch on the trunk (the area of the body that a t-shirt would cover) but does not appear on sun exposed areas. The rash is usually preceded by an initial larger spot called the "herald patch" a week or more before the rest of the rash appears. Generally within one to two days the rash appears rapidly on the trunk, upper arms, and sometimes the upper legs. The rash usually appears as flat, oval patches of scaly pink color. The rash can also be raised and one is able to feel it with a finger. The rash can also be finely crinkled and may slough off leaving a ring of scale around the spot. Sometimes a mild sore throat is present with the rash. It usually affects children and young adults in the spring and autumn. Women are more frequently affected than men.  TREATMENT   Pityriasis rosea is a self-limited condition. This means it goes away within 4 to 8 weeks without treatment. The spots may persist for several months, especially in darker-colored skin after the rash has resolved and healed. Benadryl and steroid creams may be used if itching is a problem.  SEEK MEDICAL CARE IF:   · Your rash does not go away or persists longer than three months.  · You develop fever and joint pain.  · You develop severe headache and confusion.  · You develop breathing difficulty, vomiting and/or extreme weakness.  Document Released: 06/16/2001 Document Revised: 08/02/2011 Document Reviewed: 07/05/2008  ExitCare® Patient Information ©2015 [employer]. This information is not intended to replace advice given to you by your health care provider. Make sure you discuss any questions you have with your health care provider.

## 2014-09-26 NOTE — Assessment & Plan Note (Signed)
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 Pap smear done today.

## 2014-09-27 LAB — HIV ANTIBODY (ROUTINE TESTING W REFLEX): HIV 1&2 Ab, 4th Generation: NONREACTIVE

## 2014-09-27 LAB — CYTOLOGY - PAP

## 2014-09-27 LAB — RPR

## 2014-09-28 LAB — CERVICOVAGINAL ANCILLARY ONLY
Bacterial vaginitis: NEGATIVE
CANDIDA VAGINITIS: NEGATIVE

## 2014-09-30 ENCOUNTER — Encounter: Payer: Self-pay | Admitting: *Deleted

## 2014-10-01 LAB — CERVICOVAGINAL ANCILLARY ONLY: HERPES (WINDOWPATH): NEGATIVE

## 2014-12-30 IMAGING — CR DG KNEE AP/LAT W/ SUNRISE*R*
3 series · 3 of 3 positions shown · non-contrast
Comparison: None.

CLINICAL DATA: Bilateral knee pain

EXAM:
DG KNEE - 3 VIEWS

[view not recorded (1 of 3)]
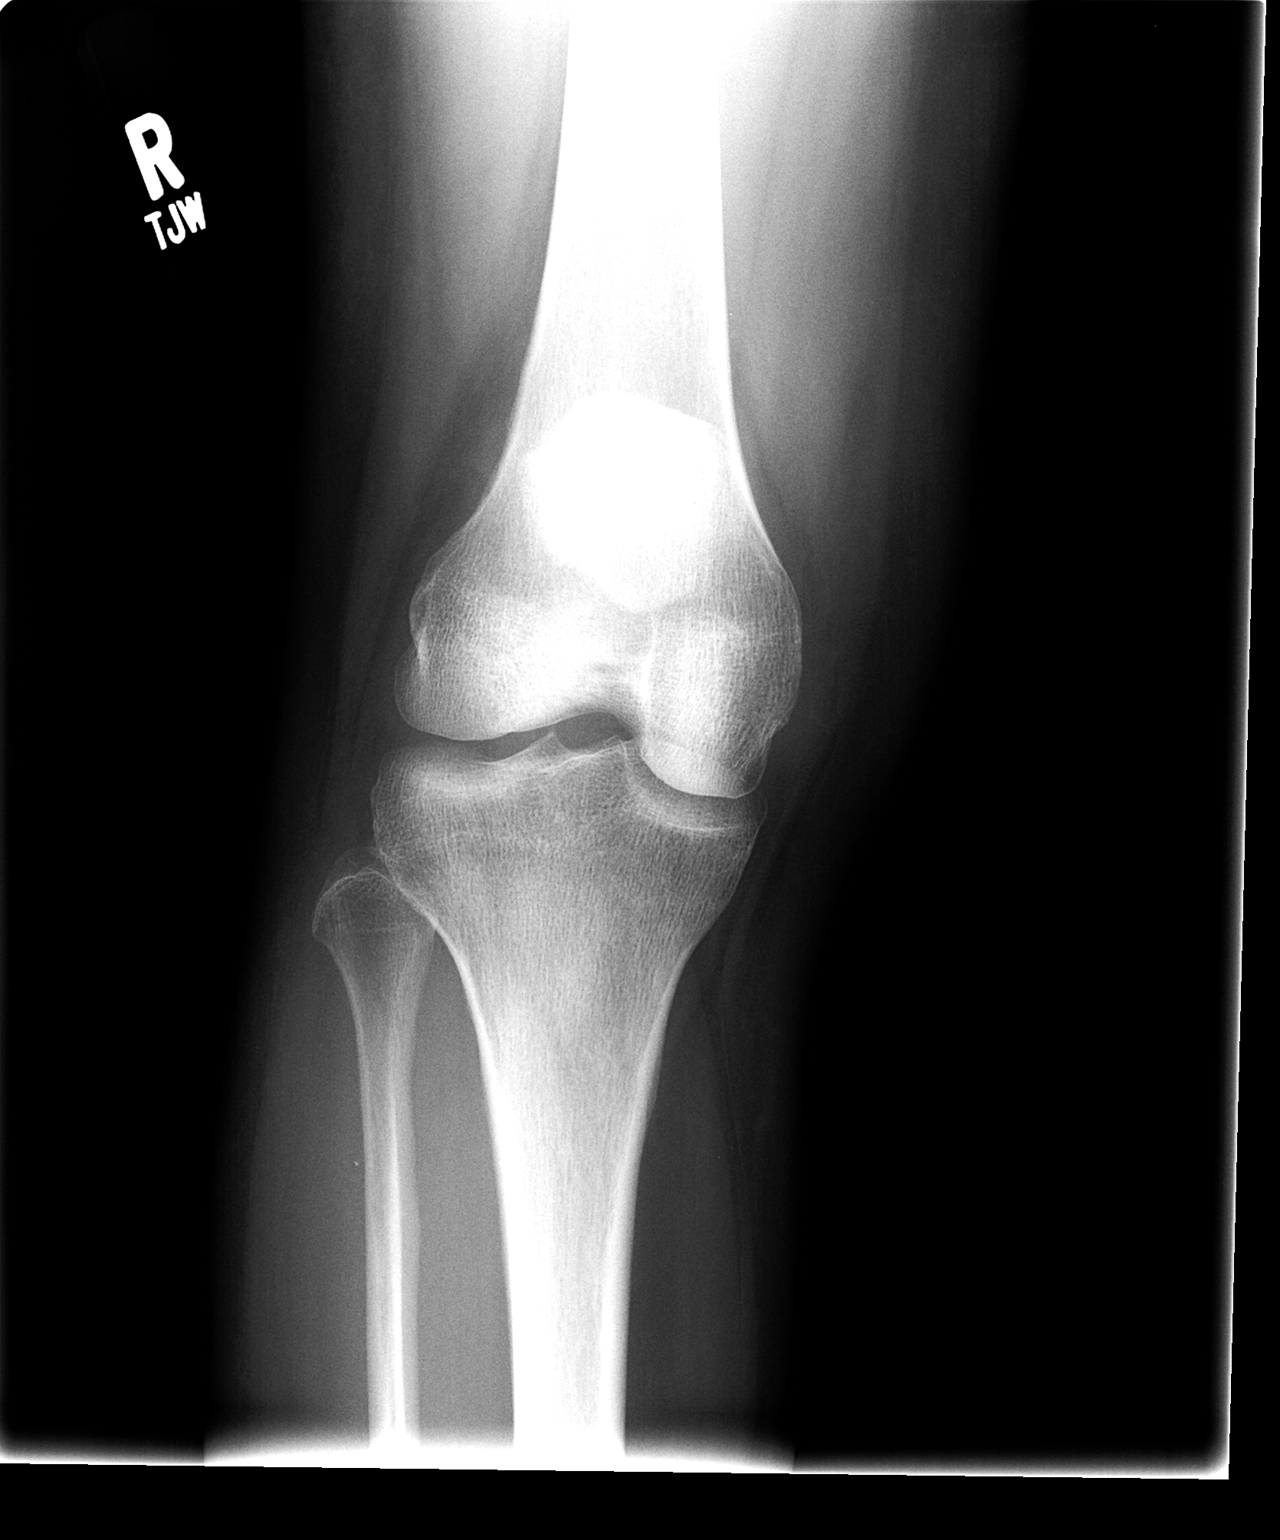

[view not recorded (2 of 3)]
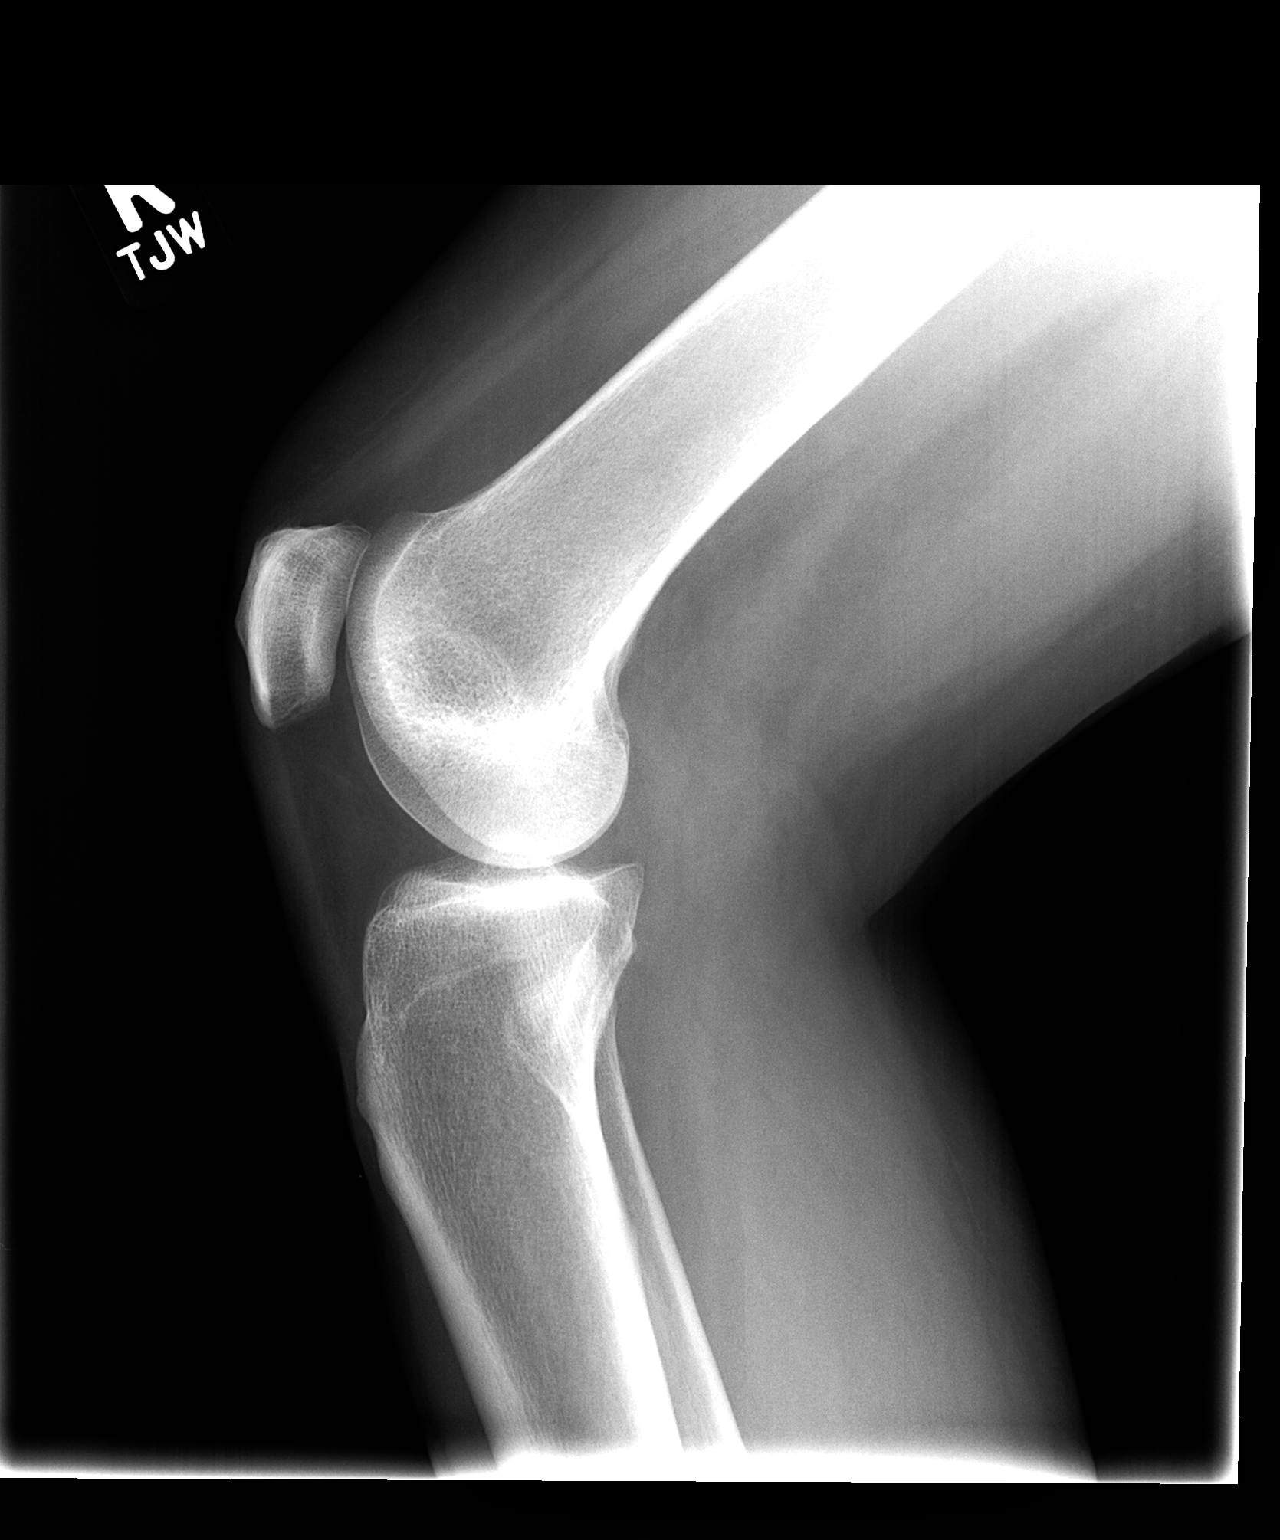

[view not recorded (3 of 3)]
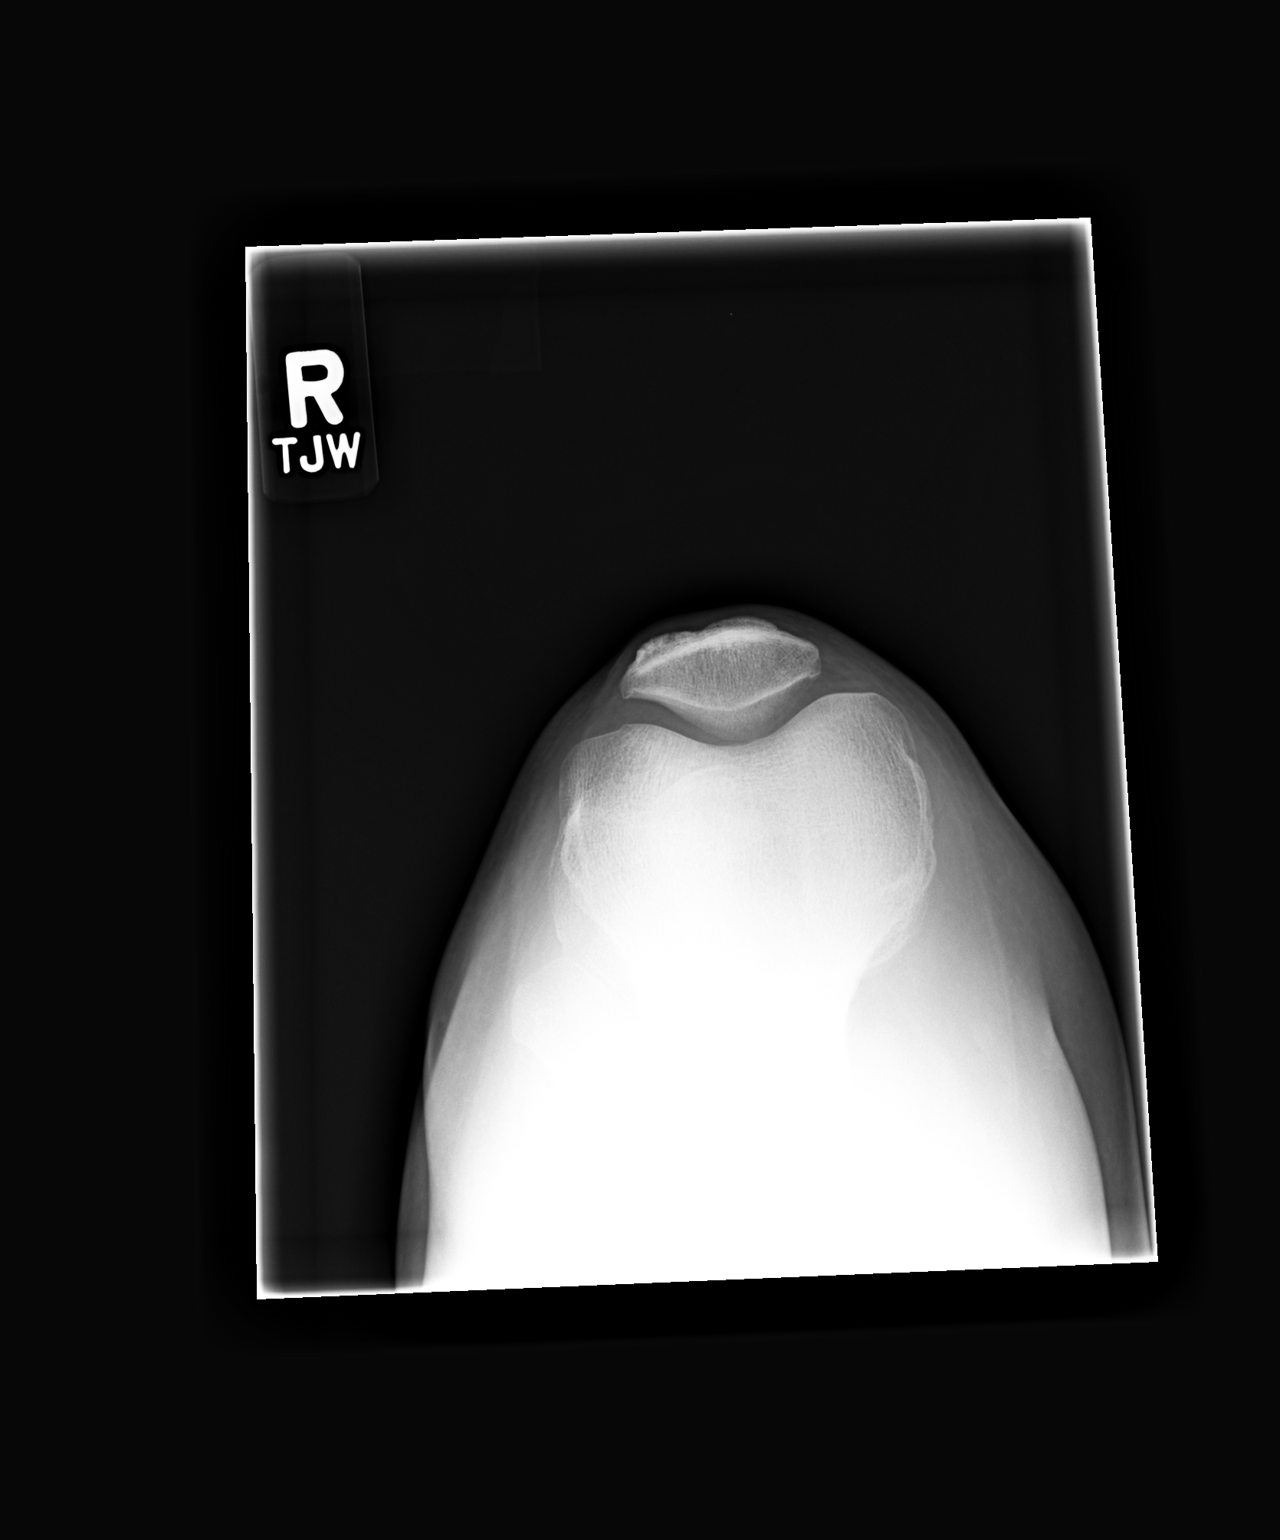

[3 of 3 positions shown; findings below may reference images not displayed]

FINDINGS: There is no evidence of fracture, dislocation, or joint effusion.
There is no evidence of arthropathy or other focal bone abnormality.
Soft tissues are unremarkable.
IMPRESSION: Negative.

## 2014-12-30 IMAGING — CR DG KNEE AP/LAT W/ SUNRISE*L*
3 series · 3 of 3 positions shown · non-contrast
Comparison: None.

CLINICAL DATA: Bilateral knee pain

EXAM:
DG KNEE - 3 VIEWS

[view not recorded (1 of 3)]
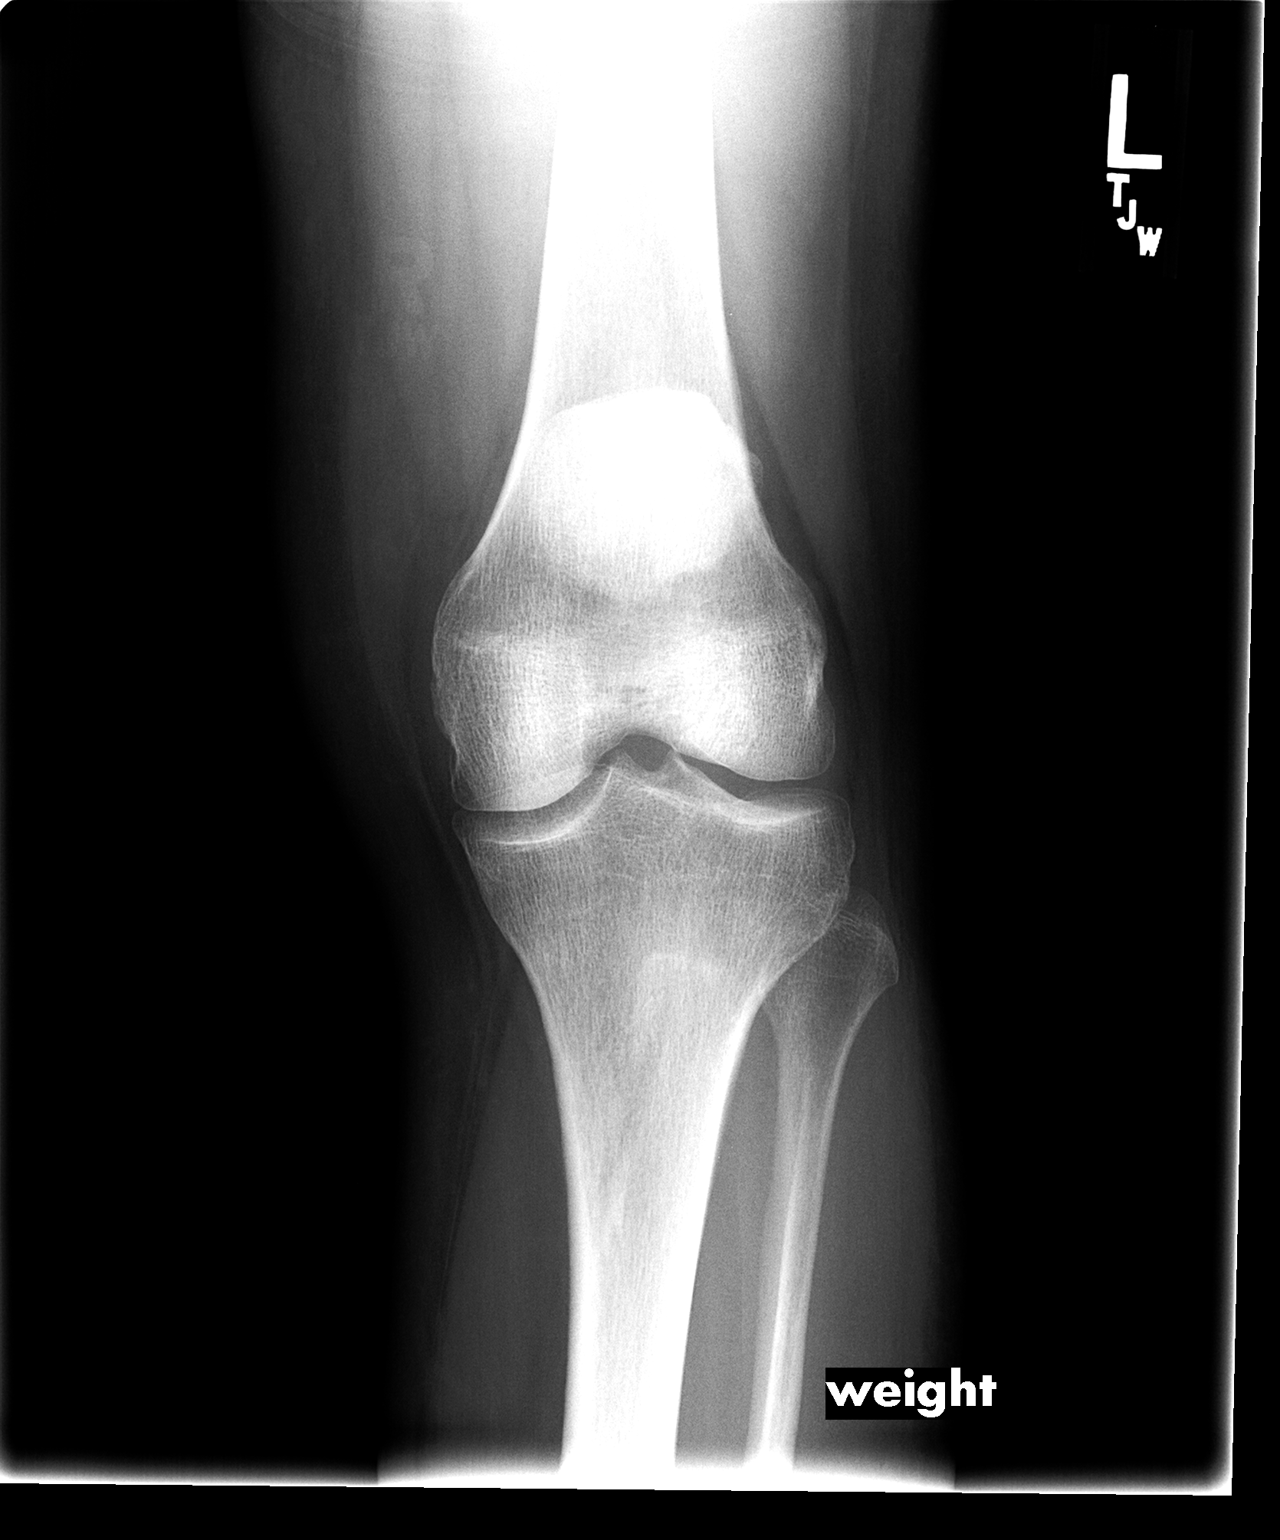

[view not recorded (2 of 3)]
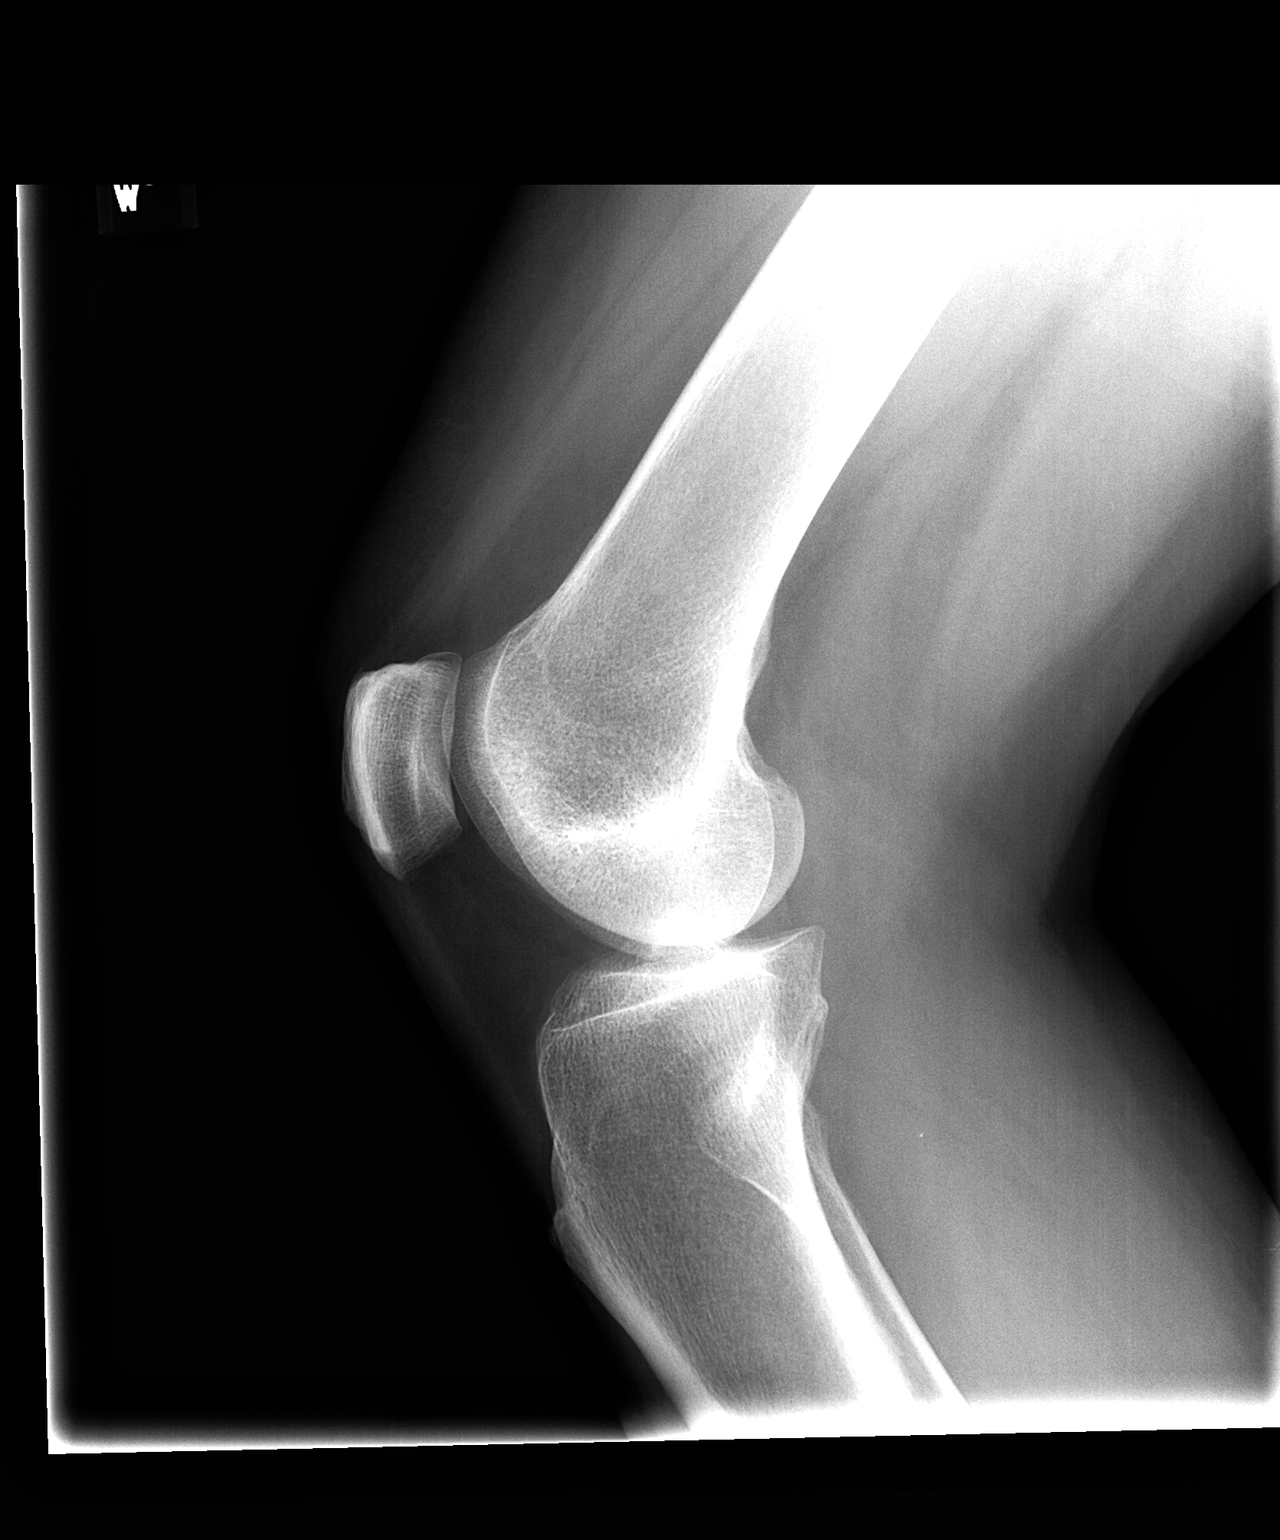

[view not recorded (3 of 3)]
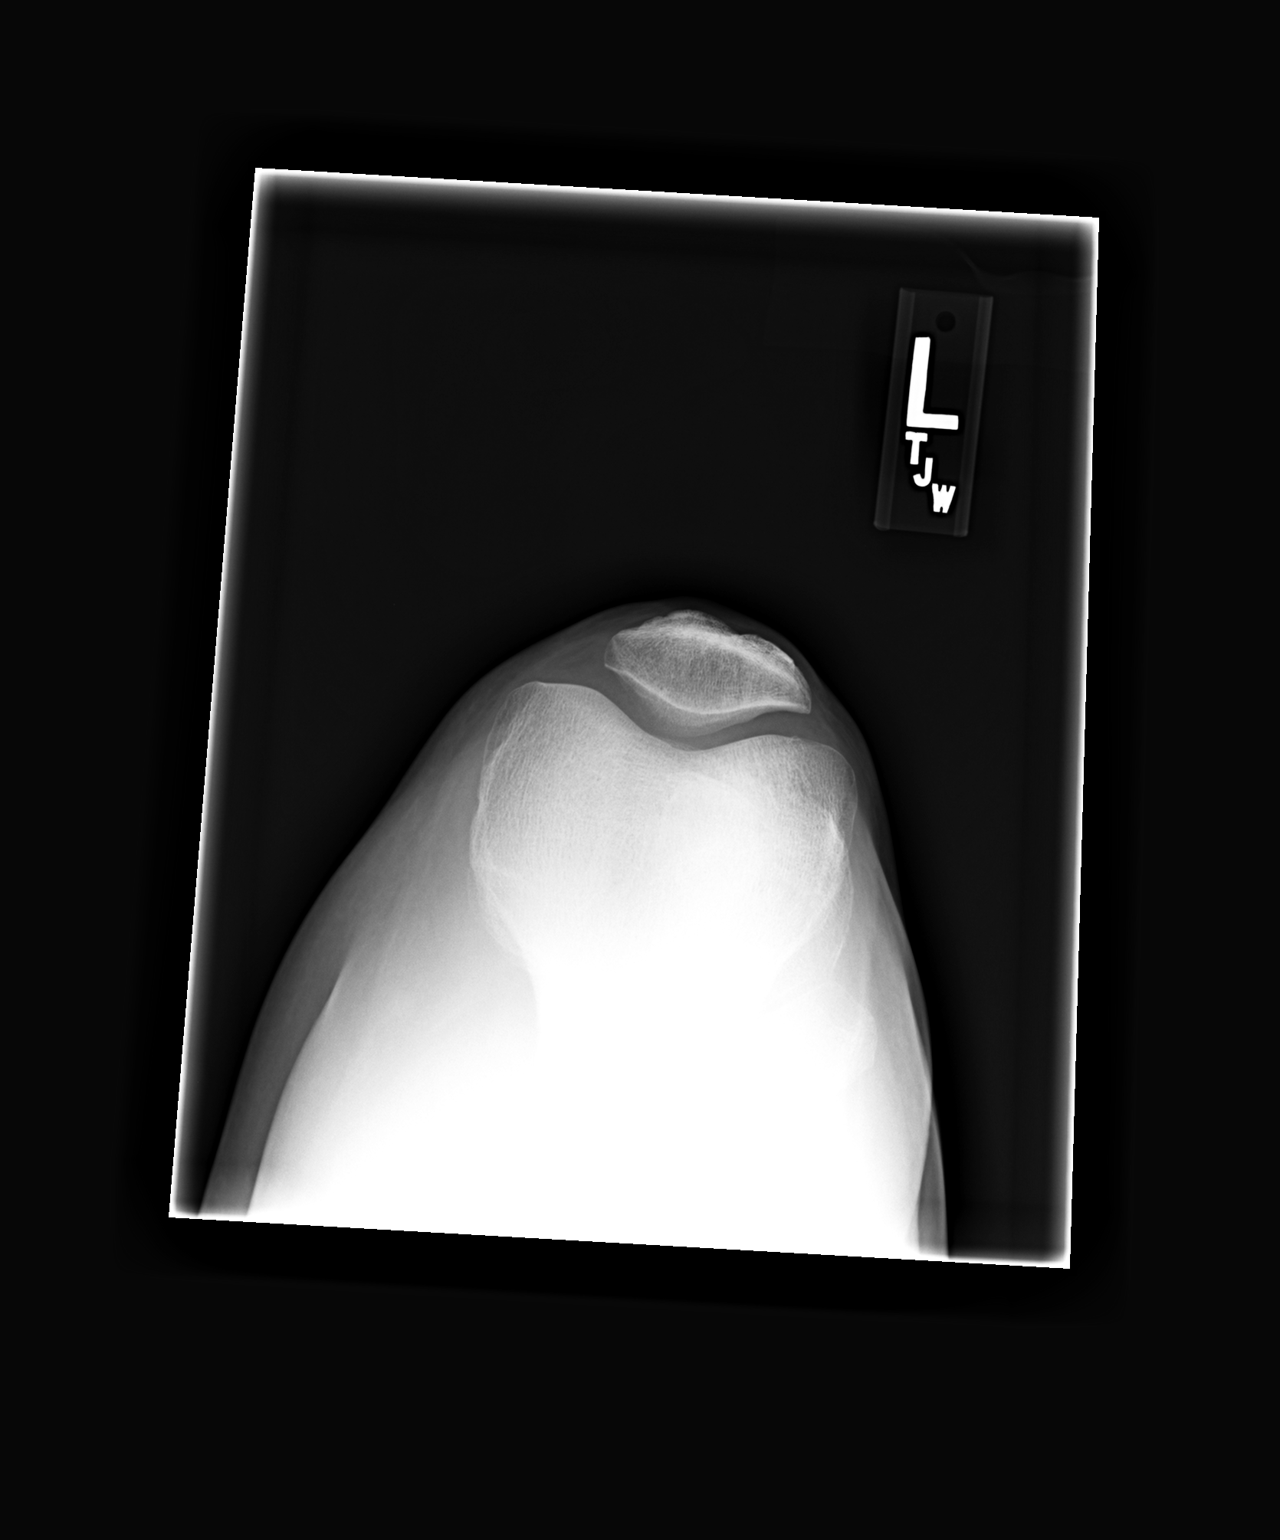

[3 of 3 positions shown; findings below may reference images not displayed]

FINDINGS: There is no evidence of fracture, dislocation, or joint effusion.
There is no evidence of arthropathy or other focal bone abnormality.
Soft tissues are unremarkable.
IMPRESSION: Negative.

## 2015-04-14 ENCOUNTER — Other Ambulatory Visit: Payer: Self-pay

## 2015-04-14 MED ORDER — AMLODIPINE BESYLATE 5 MG PO TABS: ORAL_TABLET | ORAL | Status: DC

## 2015-04-14 MED ORDER — BENAZEPRIL HCL 40 MG PO TABS: ORAL_TABLET | ORAL | Status: DC

## 2015-04-14 NOTE — Telephone Encounter (Signed)
Pt request refill 90 day supply on amlodipine and benazeparil to [facility]. Last annual 09/26/14. Pt working in [place]; advised pt done.

## 2015-10-08 ENCOUNTER — Encounter: Payer: Self-pay | Admitting: Family Medicine

## 2015-10-08 ENCOUNTER — Ambulatory Visit (INDEPENDENT_AMBULATORY_CARE_PROVIDER_SITE_OTHER): Payer: BLUE CROSS/BLUE SHIELD | Admitting: Family Medicine

## 2015-10-08 VITALS — BP 138/84 | HR 72 | Temp 98.0°F | Wt 132.2 lb

## 2015-10-08 DIAGNOSIS — I10 Essential (primary) hypertension: Secondary | ICD-10-CM

## 2015-10-08 DIAGNOSIS — R21 Rash and other nonspecific skin eruption: Secondary | ICD-10-CM

## 2015-10-08 MED ORDER — BENAZEPRIL HCL 40 MG PO TABS: ORAL_TABLET | ORAL | Status: DC

## 2015-10-08 MED ORDER — AMLODIPINE BESYLATE 5 MG PO TABS: ORAL_TABLET | ORAL | Status: DC

## 2015-10-08 MED ORDER — GABAPENTIN 400 MG PO CAPS: 400.0000 mg | ORAL_CAPSULE | Freq: Three times a day (TID) | ORAL | Status: AC

## 2015-10-08 NOTE — Assessment & Plan Note (Signed)
Well controlled on current. Refusing labs, returning within the next few months for CPX and would like labs done then.

## 2015-10-08 NOTE — Patient Instructions (Signed)
Great to see you. Please schedule an appointment for physical and labs.

## 2015-10-08 NOTE — Progress Notes (Signed)
Pre visit review using our clinic review tool, if applicable. No additional management support is needed unless otherwise documented below in the visit note. 
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
## 2015-10-08 NOTE — Addendum Note (Signed)
Addended by: Lucille Passy on: 10/08/2015 10:44 AM   Modules accepted: Orders

## 2016-08-13 ENCOUNTER — Other Ambulatory Visit: Payer: Self-pay | Admitting: Family Medicine

## 2023-11-01 ENCOUNTER — Other Ambulatory Visit (HOSPITAL_COMMUNITY): Payer: Self-pay
